# Patient Record
Sex: Female | Born: 1955 | Race: White | Hispanic: No | Marital: Single | Smoking: Never smoker
Health system: Southern US, Community
[De-identification: ages and names within clinical notes are randomized; demographics above are authoritative.]

## PROBLEM LIST (undated history)

## (undated) DIAGNOSIS — M797 Fibromyalgia: Secondary | ICD-10-CM

## (undated) DIAGNOSIS — G47419 Narcolepsy without cataplexy: Secondary | ICD-10-CM

## (undated) DIAGNOSIS — I10 Essential (primary) hypertension: Secondary | ICD-10-CM

## (undated) DIAGNOSIS — G9332 Myalgic encephalomyelitis/chronic fatigue syndrome: Secondary | ICD-10-CM

## (undated) HISTORY — PX: TONSILLECTOMY: SUR1361

## (undated) HISTORY — DX: Myalgic encephalomyelitis/chronic fatigue syndrome: G93.32

---

## 1998-03-05 ENCOUNTER — Ambulatory Visit (HOSPITAL_COMMUNITY): Admission: RE | Admit: 1998-03-05 | Discharge: 1998-03-05 | Payer: Self-pay | Admitting: Internal Medicine

## 1999-05-20 ENCOUNTER — Emergency Department (HOSPITAL_COMMUNITY): Admission: EM | Admit: 1999-05-20 | Discharge: 1999-05-20 | Payer: Self-pay | Admitting: Emergency Medicine

## 2001-01-09 ENCOUNTER — Emergency Department (HOSPITAL_COMMUNITY): Admission: EM | Admit: 2001-01-09 | Discharge: 2001-01-09 | Payer: Self-pay | Admitting: Emergency Medicine

## 2001-01-09 ENCOUNTER — Encounter: Payer: Self-pay | Admitting: Emergency Medicine

## 2006-10-11 ENCOUNTER — Encounter: Admission: RE | Admit: 2006-10-11 | Discharge: 2006-10-11 | Payer: Self-pay | Admitting: Internal Medicine

## 2007-07-30 ENCOUNTER — Encounter: Admission: RE | Admit: 2007-07-30 | Discharge: 2007-07-30 | Payer: Self-pay | Admitting: Internal Medicine

## 2010-11-28 ENCOUNTER — Encounter: Payer: Self-pay | Admitting: Internal Medicine

## 2018-07-11 ENCOUNTER — Ambulatory Visit (INDEPENDENT_AMBULATORY_CARE_PROVIDER_SITE_OTHER): Payer: Medicare Other

## 2018-07-11 ENCOUNTER — Ambulatory Visit (HOSPITAL_COMMUNITY)
Admission: EM | Admit: 2018-07-11 | Discharge: 2018-07-11 | Disposition: A | Discharge status: Home / Self Care | Payer: Medicare Other | Attending: Family Medicine | Admitting: Family Medicine

## 2018-07-11 ENCOUNTER — Encounter (HOSPITAL_COMMUNITY): Payer: Self-pay

## 2018-07-11 DIAGNOSIS — J181 Lobar pneumonia, unspecified organism: Secondary | ICD-10-CM

## 2018-07-11 DIAGNOSIS — R05 Cough: Secondary | ICD-10-CM | POA: Diagnosis not present

## 2018-07-11 DIAGNOSIS — R0789 Other chest pain: Secondary | ICD-10-CM

## 2018-07-11 DIAGNOSIS — J189 Pneumonia, unspecified organism: Secondary | ICD-10-CM

## 2018-07-11 HISTORY — DX: Fibromyalgia: M79.7

## 2018-07-11 HISTORY — DX: Narcolepsy without cataplexy: G47.419

## 2018-07-11 MED ORDER — BENZONATATE 100 MG PO CAPS: 100.0000 mg | ORAL_CAPSULE | Freq: Three times a day (TID) | ORAL | 0 refills | Status: DC

## 2018-07-11 MED ORDER — NAPROXEN 500 MG PO TABS: 500.0000 mg | ORAL_TABLET | Freq: Two times a day (BID) | ORAL | 0 refills | Status: DC

## 2018-07-11 MED ORDER — AZITHROMYCIN 250 MG PO TABS: ORAL_TABLET | ORAL | 0 refills | Status: AC

## 2018-07-11 NOTE — ED Triage Notes (Signed)
Pt presents with persistent cough, chest congestion and shoulder & neck pain .

## 2018-07-11 NOTE — ED Provider Notes (Signed)
[facility]    CSN: [id number] Arrival date & time: 07/11/18  1130     History   Chief Complaint Chief Complaint  Patient presents with  . Cough  . Neck & Shoulder Pain  . Chest Congestion    HPI Catherine Adkins is a 62 y.o. female.   Catherine Adkins presents with complaints of cough causing left posterior shoulder/upper back pain, upper chest pain, as well as neck pain. This has been ongoing for the past 5 days. Cough is non productive. No shortness of breath . No known fevers. Some sore throat. No ear pain. Has had loose stools, two episodes today, no blood. States has history of "sinus issues" with congestion. Has leg pain as well as wrist pain, states these can be normal flares for her, takes gabapentin. Has taken tylenol as well which has not helped. No known ill contacts. Has had pneumonia and has been hospitalized with such in the past. Hx of fibromyalgia and narcolepsy.    ROS per HPI.      Past Medical History:  Diagnosis Date  . Fibromyalgia   . Narcolepsy     There are no active problems to display for this patient.   Past Surgical History:  Procedure Laterality Date  . TONSILLECTOMY      OB History   None      Home Medications    Prior to Admission medications   Medication Sig Start Date End Date Taking? Authorizing Provider  azithromycin (ZITHROMAX) 250 MG tablet Take 2 tablets (500 mg total) by mouth daily for 1 day, THEN 1 tablet (250 mg total) daily for 4 days. 07/11/18 07/16/18  Georgetta Haber, NP  benzonatate (TESSALON) 100 MG capsule Take 1 capsule (100 mg total) by mouth every 8 (eight) hours. 07/11/18   Georgetta Haber, NP  naproxen (NAPROSYN) 500 MG tablet Take 1 tablet (500 mg total) by mouth 2 (two) times daily. 07/11/18   Georgetta Haber, NP    Family History History reviewed. No pertinent family history.  Social History Social History   Tobacco Use  . Smoking status: Not on file  Substance Use Topics  . Alcohol use: Not on  file  . Drug use: Not on file     Allergies   Patient has no known allergies.   Review of Systems Review of Systems   Physical Exam Triage Vital Signs ED Triage Vitals  Enc Vitals Group     BP 07/11/18 1240 (!) 138/59     Pulse Rate 07/11/18 1235 92     Resp 07/11/18 1235 19     Temp 07/11/18 1235 98.8 F (37.1 C)     Temp Source 07/11/18 1235 Oral     SpO2 07/11/18 1235 96 %     Weight --      Height --      Head Circumference --      Peak Flow --      Pain Score --      Pain Loc --      Pain Edu? --      Excl. in GC? --    No data found.  Updated Vital Signs BP (!) 138/59 (BP Location: Right Arm)   Pulse 92   Temp 98.8 F (37.1 C) (Oral)   Resp 19   LMP  (LMP Unknown)   SpO2 96%   Physical Exam  Constitutional: She is oriented to person, place, and time. She appears well-developed and well-nourished. No distress.  HENT:  Head: Normocephalic and atraumatic.  Right Ear: Tympanic membrane, external ear and ear canal normal.  Left Ear: Tympanic membrane, external ear and ear canal normal.  Nose: Nose normal.  Mouth/Throat: Uvula is midline, oropharynx is clear and moist and mucous membranes are normal. No tonsillar exudate.  Eyes: Pupils are equal, round, and reactive to light. Conjunctivae and EOM are normal.  Cardiovascular: Normal rate, regular rhythm and normal heart sounds.  Pulmonary/Chest: Effort normal and breath sounds normal. No stridor. She has no decreased breath sounds.     She exhibits tenderness.  Strong dry cough; posterior left chest wall with mild tenderness on palpation and worse with cough/deep breathing   Neurological: She is alert and oriented to person, place, and time.  Skin: Skin is warm and dry.   EKG NSR rate of 70 without acute changes noted   UC Treatments / Results  Labs (all labs ordered are listed, but only abnormal results are displayed) Labs Reviewed - No data to display  EKG None  Radiology Dg Chest 2  View  Result Date: 07/11/2018 CLINICAL DATA:  Five days of nonproductive cough, pain deep to the left shoulder blade. History of previous episodes of pneumonia and bronchitis. Nonsmoker. EXAM: CHEST - 2 VIEW COMPARISON:  Report of a chest x-ray of September 05, 2006. FINDINGS: There is hazy increased density in the posterior inferior aspect of the left upper lobe. The right lung is clear. There is no pleural effusion. The heart and pulmonary vascularity are normal. The mediastinum is normal in width. There may be a hiatal hernia. The bony thorax exhibits no acute abnormality. IMPRESSION: Hazy increased density in the left upper lobe posteriorly compatible with pneumonia. Followup PA and lateral chest X-ray is recommended in 3-4 weeks following trial of antibiotic therapy to ensure resolution and exclude underlying malignancy. Electronically Signed   By: David  [place] M.D.   On: 07/11/2018 14:00    Procedures Procedures (including critical care time)  Medications Ordered in UC Medications - No data to display  Initial Impression / Assessment and Plan / UC Course  I have reviewed the triage vital signs and the nursing notes.  Pertinent labs & imaging results that were available during my care of the patient were reviewed by me and considered in my medical decision making (see chart for details).     ekg without acute findings. Chest xray concerning with pneumonia, which is consistent with history. Course of azithromycin provided, tessalon prn, naproxen BID. Encouraged close follow up with PCP for recheck. Return precautions provided. Patient verbalized understanding and agreeable to plan.   Final Clinical Impressions(s) / UC Diagnoses   Final diagnoses:  Community acquired pneumonia of left upper lobe of lung [facility])     Discharge Instructions     Complete course of antibiotics.  Push fluids to ensure adequate hydration and keep secretions thin.  Naproxen twice a day to help with pain, take  with food.  Tessalon as needed for cough.  Please call and make appointment with PCP in the next week as you will likely need a repeat chest xray in the next month as well.  If develop increased pain, fevers, shortness of breath , weakness, confusion or otherwise worsening please return or go to the Er.     ED Prescriptions    Medication Sig Dispense Auth. Provider   azithromycin (ZITHROMAX) 250 MG tablet Take 2 tablets (500 mg total) by mouth daily for 1 day, THEN 1 tablet (250 mg total)  daily for 4 days. 6 tablet Linus Mako B, NP   naproxen (NAPROSYN) 500 MG tablet Take 1 tablet (500 mg total) by mouth 2 (two) times daily. 30 tablet Linus Mako B, NP   benzonatate (TESSALON) 100 MG capsule Take 1 capsule (100 mg total) by mouth every 8 (eight) hours. 21 capsule Georgetta Haber, NP     Controlled Substance Prescriptions [place] Controlled Substance Registry consulted? Not Applicable   Georgetta Haber, NP 07/11/18 1407

## 2018-07-11 NOTE — Discharge Instructions (Signed)
Complete course of antibiotics.  Push fluids to ensure adequate hydration and keep secretions thin.  Naproxen twice a day to help with pain, take with food.  Tessalon as needed for cough.  Please call and make appointment with PCP in the next week as you will likely need a repeat chest xray in the next month as well.  If develop increased pain, fevers, shortness of breath , weakness, confusion or otherwise worsening please return or go to the Er.

## 2018-12-07 ENCOUNTER — Ambulatory Visit (INDEPENDENT_AMBULATORY_CARE_PROVIDER_SITE_OTHER): Payer: Medicare Other

## 2018-12-07 ENCOUNTER — Ambulatory Visit (HOSPITAL_COMMUNITY)
Admission: EM | Admit: 2018-12-07 | Discharge: 2018-12-07 | Disposition: A | Discharge status: Home / Self Care | Payer: Medicare Other

## 2018-12-07 ENCOUNTER — Encounter (HOSPITAL_COMMUNITY): Payer: Self-pay | Admitting: Emergency Medicine

## 2018-12-07 DIAGNOSIS — J189 Pneumonia, unspecified organism: Secondary | ICD-10-CM | POA: Diagnosis not present

## 2018-12-07 HISTORY — DX: Essential (primary) hypertension: I10

## 2018-12-07 MED ORDER — HYDROCODONE-HOMATROPINE 5-1.5 MG/5ML PO SYRP: 5.0000 mL | ORAL_SOLUTION | Freq: Four times a day (QID) | ORAL | 0 refills | Status: DC | PRN

## 2018-12-07 MED ORDER — BENZONATATE 100 MG PO CAPS: 100.0000 mg | ORAL_CAPSULE | Freq: Three times a day (TID) | ORAL | 0 refills | Status: DC

## 2018-12-07 MED ORDER — AMOXICILLIN-POT CLAVULANATE 875-125 MG PO TABS: 1.0000 | ORAL_TABLET | Freq: Two times a day (BID) | ORAL | 0 refills | Status: DC

## 2018-12-07 NOTE — ED Triage Notes (Signed)
[facility] cough, nasal congestion, headaches, emesis with strong coughing, nausea x 2 weeks.

## 2018-12-07 NOTE — ED Provider Notes (Signed)
[facility]    CSN: [id number] Arrival date & time: 12/07/18  1047     History   Chief Complaint Chief Complaint  Patient presents with  . URI    HPI Catherine Adkins is a 63 y.o. female.   Patient is a 63 year old female past medical history of fibromyalgia, hypertension.  She presents with approximately 2 weeks of worsening cough, congestion, fatigue, fever.  She is having a lot of sinus pressure and facial pain.  She does have history of sinus infections.  She is also had some mild nausea without vomiting.  She has been taking Mucinex and Mucinex DM for the last 2 weeks without any relief of symptoms.  Coughing up minimal sputum. Denies any recent traveling or recent sick contacts. She does not smoke.   ROS per HPI      Past Medical History:  Diagnosis Date  . Fibromyalgia   . Hypertension   . Narcolepsy   . Narcolepsy     There are no active problems to display for this patient.   Past Surgical History:  Procedure Laterality Date  . TONSILLECTOMY      OB History   No obstetric history on file.      Home Medications    Prior to Admission medications   Medication Sig Start Date End Date Taking? Authorizing Provider  gabapentin (NEURONTIN) 600 MG tablet Take 600 mg by mouth 3 (three) times daily.   Yes [provider]  olmesartan (BENICAR) 20 MG tablet Take 20 mg by mouth daily.   Yes [provider]  sertraline (ZOLOFT) 50 MG tablet Take 50 mg by mouth daily.   Yes [provider]  amoxicillin-clavulanate (AUGMENTIN) 875-125 MG tablet Take 1 tablet by mouth every 12 (twelve) hours. 12/07/18   Dahlia Byes A, NP  benzonatate (TESSALON) 100 MG capsule Take 1 capsule (100 mg total) by mouth every 8 (eight) hours. 12/07/18   Anella Nakata, Gloris Manchester A, NP  HYDROcodone-homatropine (HYCODAN) 5-1.5 MG/5ML syrup Take 5 mLs by mouth every 6 (six) hours as needed for cough. 12/07/18   Dahlia Byes A, NP  naproxen (NAPROSYN) 500 MG tablet Take 1  tablet (500 mg total) by mouth 2 (two) times daily. 07/11/18   Georgetta Haber, NP    Family History History reviewed. No pertinent family history.  Social History Social History   Tobacco Use  . Smoking status: Never Smoker  . Smokeless tobacco: Never Used  Substance Use Topics  . Alcohol use: Not Currently  . Drug use: Never     Allergies   Prednisone   Review of Systems Review of Systems   Physical Exam Triage Vital Signs ED Triage Vitals [12/07/18 1147]  Enc Vitals Group     BP (!) 144/77     Pulse Rate 88     Resp 18     Temp 98.4 F (36.9 C)     Temp Source Oral     SpO2 97 %     Weight      Height      Head Circumference      Peak Flow      Pain Score 10     Pain Loc      Pain Edu?      Excl. in GC?    No data found.  Updated Vital Signs BP (!) 144/77 (BP Location: Right Arm)   Pulse 88   Temp 98.4 F (36.9 C) (Oral)   Resp 18  LMP  (LMP Unknown)   SpO2 97%   Visual Acuity Right Eye Distance:   Left Eye Distance:   Bilateral Distance:    Right Eye Near:   Left Eye Near:    Bilateral Near:     Physical Exam Constitutional:      General: She is not in acute distress.    Appearance: She is ill-appearing. She is not toxic-appearing or diaphoretic.  HENT:     Head: Normocephalic and atraumatic.     Right Ear: Tympanic membrane and ear canal normal. There is impacted cerumen.     Left Ear: Tympanic membrane and ear canal normal.     Nose: Mucosal edema, congestion and rhinorrhea present.     Mouth/Throat:     Mouth: No oral lesions.     Tongue: Lesions present.     Pharynx: Oropharynx is clear.  Neck:     Musculoskeletal: Normal range of motion.  Cardiovascular:     Rate and Rhythm: Normal rate and regular rhythm.     Pulses: Normal pulses.     Heart sounds: Normal heart sounds.  Pulmonary:     Effort: Pulmonary effort is normal.     Comments: Decreased lung sounds in all fields Harsh cough.  Musculoskeletal: Normal range of  motion.  Lymphadenopathy:     Cervical: Cervical adenopathy present.  Skin:    General: Skin is warm and dry.  Psychiatric:        Mood and Affect: Mood normal.      UC Treatments / Results  Labs (all labs ordered are listed, but only abnormal results are displayed) Labs Reviewed - No data to display  EKG None  Radiology Dg Chest 2 View  Result Date: 12/07/2018 CLINICAL DATA:  Productive cough, chest pain, fever. EXAM: CHEST - 2 VIEW COMPARISON:  Radiographs of July 11, 2018. FINDINGS: The heart size and mediastinal contours are within normal limits. No pneumothorax or pleural effusion is noted. Right lung is clear. Mild left basilar atelectasis or infiltrate is noted. The visualized skeletal structures are unremarkable. IMPRESSION: Mild left basilar opacity is noted concerning for atelectasis or infiltrate. Continued radiographic follow-up is recommended. Electronically Signed   By: Lupita RaiderJames  Green Jr, M.D.   On: 12/07/2018 13:06    Procedures Procedures (including critical care time)  Medications Ordered in UC Medications - No data to display  Initial Impression / Assessment and Plan / UC Course  I have reviewed the triage vital signs and the nursing notes.  Pertinent labs & imaging results that were available during my care of the patient were reviewed by me and considered in my medical decision making (see chart for details).     Patient is a 63 year old female that presents with continued congestion, cough and fatigue over the past 2 weeks.  Her symptoms have worsened. Her x-ray did reveal pneumonia We will go ahead and treat for community-acquired pneumonia Augmentin twice a day for 7 days Tessalon Perles for cough as needed every 8 hours Hycodan cough syrup to use at bedtime for worsening cough Follow up as needed for continued or worsening symptoms  Final Clinical Impressions(s) / UC Diagnoses   Final diagnoses:  Community acquired pneumonia, unspecified  laterality     Discharge Instructions     Your x-ray did reveal pneumonia We will treat this with antibiotics Augmentin twice a day for 7 days You can do Tessalon Perles every 8 hours for cough    ED Prescriptions    Medication Sig  Dispense Auth. Provider   amoxicillin-clavulanate (AUGMENTIN) 875-125 MG tablet Take 1 tablet by mouth every 12 (twelve) hours. 14 tablet Darletta Noblett A, NP   benzonatate (TESSALON) 100 MG capsule Take 1 capsule (100 mg total) by mouth every 8 (eight) hours. 21 capsule Desarea Ohagan A, NP   HYDROcodone-homatropine (HYCODAN) 5-1.5 MG/5ML syrup Take 5 mLs by mouth every 6 (six) hours as needed for cough. 120 mL Dahlia Byes A, NP     Controlled Substance Prescriptions [place] Controlled Substance Registry consulted? Not Applicable   Janace Aris, NP 12/07/18 2054

## 2018-12-07 NOTE — Discharge Instructions (Signed)
Your x-ray did reveal pneumonia We will treat this with antibiotics Augmentin twice a day for 7 days You can do Tessalon Perles every 8 hours for cough

## 2021-11-07 ENCOUNTER — Emergency Department (HOSPITAL_BASED_OUTPATIENT_CLINIC_OR_DEPARTMENT_OTHER)
Admission: EM | Admit: 2021-11-07 | Discharge: 2021-11-07 | Disposition: A | Discharge status: Home / Self Care | Payer: Medicare Other | Attending: Emergency Medicine | Admitting: Emergency Medicine

## 2021-11-07 ENCOUNTER — Encounter (HOSPITAL_BASED_OUTPATIENT_CLINIC_OR_DEPARTMENT_OTHER): Payer: Self-pay

## 2021-11-07 ENCOUNTER — Other Ambulatory Visit: Payer: Self-pay

## 2021-11-07 DIAGNOSIS — R059 Cough, unspecified: Secondary | ICD-10-CM | POA: Diagnosis present

## 2021-11-07 DIAGNOSIS — U071 COVID-19: Secondary | ICD-10-CM | POA: Diagnosis not present

## 2021-11-07 DIAGNOSIS — R110 Nausea: Secondary | ICD-10-CM | POA: Insufficient documentation

## 2021-11-07 LAB — BASIC METABOLIC PANEL
Anion gap: 7 (ref 5–15)
BUN: 13 mg/dL (ref 8–23)
CO2: 28 mmol/L (ref 22–32)
Calcium: 9.1 mg/dL (ref 8.9–10.3)
Chloride: 106 mmol/L (ref 98–111)
Creatinine, Ser: 0.85 mg/dL (ref 0.44–1.00)
GFR, Estimated: >60 mL/min (ref >60)
Glucose, Bld: 105 mg/dL — ABNORMAL HIGH (ref 70–99)
Potassium: 3.9 mmol/L (ref 3.5–5.1)
Sodium: 141 mmol/L (ref 135–145)

## 2021-11-07 MED ORDER — NIRMATRELVIR/RITONAVIR (PAXLOVID)TABLET: 3.0000 | ORAL_TABLET | Freq: Two times a day (BID) | ORAL | 0 refills | Status: AC

## 2021-11-07 MED ORDER — BENZONATATE 100 MG PO CAPS: 100.0000 mg | ORAL_CAPSULE | Freq: Three times a day (TID) | ORAL | 0 refills | Status: DC

## 2021-11-07 NOTE — ED Triage Notes (Signed)
Pt states she tested pos for COVID today with at home test and symptoms began yesterday. Pt seeking Paxlovid prescription. Pt denies CP,  SOB. NAD during triage

## 2021-11-07 NOTE — ED Provider Notes (Signed)
[place] EMERGENCY DEPT Provider Note   CSN: [id number] Arrival date & time: 11/07/21  1232     History  Chief Complaint  Patient presents with   Covid Positive    Catherine Adkins is a 66 y.o. female.  Patient with history of pneumonia presents to the emergency department today for evaluation of COVID symptoms.  Patient started having sore throat, mild cough, congestion, body aches yesterday.  She took a home antigen test and was positive.  She reports a couple of other sick contacts in her home.  She is here requesting Paxlovid prescription to try to decrease severity of illness given her history of pneumonia.  No chest pain or shortness of breath.  She has had nausea but no vomiting. The onset of this condition was acute. The course is constant. Aggravating factors: none. Alleviating factors: none.  She has received COVID vaccines.  No history of chronic kidney disease.       Home Medications Prior to Admission medications   Medication Sig Start Date End Date Taking? Authorizing Provider  amoxicillin-clavulanate (AUGMENTIN) 875-125 MG tablet Take 1 tablet by mouth every 12 (twelve) hours. 12/07/18   Loura Halt A, NP  benzonatate (TESSALON) 100 MG capsule Take 1 capsule (100 mg total) by mouth every 8 (eight) hours. 12/07/18   Loura Halt A, NP  gabapentin (NEURONTIN) 600 MG tablet Take 600 mg by mouth 3 (three) times daily.    [provider]  HYDROcodone-homatropine (HYCODAN) 5-1.5 MG/5ML syrup Take 5 mLs by mouth every 6 (six) hours as needed for cough. 12/07/18   Loura Halt A, NP  naproxen (NAPROSYN) 500 MG tablet Take 1 tablet (500 mg total) by mouth 2 (two) times daily. 07/11/18   Zigmund Gottron, NP  olmesartan (BENICAR) 20 MG tablet Take 20 mg by mouth daily.    [provider]  sertraline (ZOLOFT) 50 MG tablet Take 50 mg by mouth daily.    [provider]      Allergies    Azithromycin and Prednisone    Review of Systems    Review of Systems  Constitutional:  Positive for fatigue. Negative for fever.  HENT:  Positive for congestion and sore throat. Negative for rhinorrhea.   Eyes:  Negative for redness.  Respiratory:  Positive for cough. Negative for shortness of breath.   Cardiovascular:  Negative for chest pain.  Gastrointestinal:  Positive for nausea. Negative for abdominal pain, diarrhea and vomiting.  Genitourinary:  Negative for dysuria, frequency, hematuria and urgency.  Musculoskeletal:  Positive for myalgias.  Skin:  Negative for rash.  Neurological:  Positive for headaches.   Physical Exam Updated Vital Signs BP 139/79    Pulse 74    Temp 97.6 F (36.4 C)    Resp 16    Ht 5\' 6"  (1.676 m)    Wt 90.7 kg    LMP  (LMP Unknown)    SpO2 99%    BMI 32.28 kg/m  Physical Exam Vitals and nursing note reviewed.  Constitutional:      Appearance: She is well-developed.  HENT:     Head: Normocephalic and atraumatic.     Jaw: No trismus.     Right Ear: Tympanic membrane, ear canal and external ear normal.     Left Ear: Tympanic membrane, ear canal and external ear normal.     Nose: Nose normal. No mucosal edema or rhinorrhea.     Mouth/Throat:     Mouth: Mucous membranes are moist. Mucous  membranes are not dry. No oral lesions.     Pharynx: Uvula midline. No oropharyngeal exudate, posterior oropharyngeal erythema or uvula swelling.     Tonsils: No tonsillar abscesses.  Eyes:     General:        Right eye: No discharge.        Left eye: No discharge.     Conjunctiva/sclera: Conjunctivae normal.  Cardiovascular:     Rate and Rhythm: Normal rate and regular rhythm.     Heart sounds: Normal heart sounds.  Pulmonary:     Effort: Pulmonary effort is normal. No respiratory distress.     Breath sounds: Wheezing present. No rales.     Comments: Minimal basilar exp wheeze Abdominal:     Palpations: Abdomen is soft.     Tenderness: There is no abdominal tenderness.  Musculoskeletal:     Cervical back:  Normal range of motion and neck supple.  Lymphadenopathy:     Cervical: No cervical adenopathy.  Skin:    General: Skin is warm and dry.  Neurological:     Mental Status: She is alert.  Psychiatric:        Mood and Affect: Mood normal.    ED Results / Procedures / Treatments   Labs (all labs ordered are listed, but only abnormal results are displayed) Labs Reviewed  BASIC METABOLIC PANEL - Abnormal; Notable for the following components:      Result Value   Glucose, Bld 105 (*)    All other components within normal limits    EKG None  Radiology No results found.  Procedures Procedures    Medications Ordered in ED Medications - No data to display  ED Course/ Medical Decision Making/ A&P    Patient seen and examined. Plan discussed with patient.   Labs: BMP to evaluate creatinine prior to antiviral prescription  Imaging: None  Medications/Fluids: DC home with oral Paxlovid, tessalon  Vital signs reviewed and are as follows: BP 139/79    Pulse 74    Temp 97.6 F (36.4 C)    Resp 16    Ht 5\' 6"  (1.676 m)    Wt 90.7 kg    LMP  (LMP Unknown)    SpO2 99%    BMI 32.28 kg/m   Initial impression: COVID-19, mild  5:53 PM BMP performed and resulted.  Normal kidney function.  Home treatment: Prescription written for Paxlovid, tessalon. Counseled to use tylenol and ibuprofen for supportive treatment.   Follow-up: Encouraged patient to follow-up with their provider if symptoms persist for 5 days.   Detailed discussion had with with patient regarding COVID-19 precautions and written instructions given as well.  We discussed need to isolate themselves for 5 days from onset of symptoms and have 24 hours of improvement prior to breaking isolation.  We discussed that when breaking isolation, mask wearing for 5 additional days is required.  We discussed signs symptoms to return which include worsening shortness of breath, trouble breathing, or increased work of breathing.  Also  return with persistent vomiting, confusion, passing out, or if they have any other concerns. Counseled on the need for rest and good hydration. Discussed that high-risk contacts should be aware of positive result and they need to quarantine and be tested if they develop any symptoms. Patient verbalizes understanding.   Catherine Adkins was evaluated in Emergency Department on 11/07/2021 for the symptoms described in the history of present illness. She was evaluated in the context of the global COVID-19 pandemic, which  necessitated consideration that the patient might be at risk for infection with the SARS-CoV-2 virus that causes COVID-19. Institutional protocols and algorithms that pertain to the evaluation of patients at risk for COVID-19 are in a state of rapid change based on information released by regulatory bodies including the CDC and federal and state organizations. These policies and algorithms were followed during the patient's care in the ED.                             Medical Decision Making  Patient with viral URI symptoms, home test positive for COVID.  Patient started on antiviral medication due to risk factors of age, history of pneumonia.  Normal kidney function confirmed.  She otherwise appears well.  Doubt bacterial pneumonia, PE, ACS.          Final Clinical Impression(s) / ED Diagnoses Final diagnoses:  COVID-19    Rx / DC Orders ED Discharge Orders          Ordered    nirmatrelvir/ritonavir EUA (PAXLOVID) 20 x 150 MG & 10 x 100MG  TABS  2 times daily        11/07/21 1751    benzonatate (TESSALON) 100 MG capsule  Every 8 hours        11/07/21 1751              Carlisle Cater, PA-C 11/07/21 1754    Fredia Sorrow, MD 11/18/21 [phone]

## 2021-11-07 NOTE — Discharge Instructions (Signed)
Please read and follow all provided instructions.  Your diagnoses today include:  1. COVID-19     Tests performed today include: Vital signs. See below for your results today.  Basic metabolic panel: no problems  Medications prescribed:  Paxlovid  Tessalon Perles - cough suppressant medication  Take any prescribed medications only as directed. Treatment for your infection is aimed at treating the symptoms. There are no medications, such as antibiotics, that will cure your infection.   Home care instructions:  Follow any educational materials contained in this packet.   Your illness is contagious and can be spread to others, especially during the first 3 or 4 days. It cannot be cured by antibiotics or other medicines. Take basic precautions such as washing your hands often, covering your mouth when you cough or sneeze, and avoiding public places where you could spread your illness to others.   Please continue drinking plenty of fluids.  Use over-the-counter medicines as needed as directed on packaging for symptom relief.  You may also use ibuprofen or tylenol as directed on packaging for pain or fever.  Do not take multiple medicines containing Tylenol or acetaminophen to avoid taking too much of this medication.  If you are positive for Covid-19, you should isolate yourself and not be exposed to other people for 5 days after your symptoms began. If you are not feeling better at day 5, you need to isolate yourself for a total of 10 days. If you are feeling better by day 5, you should wear a mask properly, over your nose and mouth, at all times while around other people until 10 days after your symptoms started.   Follow-up instructions: Please follow-up with your primary care provider as needed for further evaluation of your symptoms if you are not feeling better.   Return instructions:  Please return to the Emergency Department if you experience worsening symptoms.  Return to the  emergency department if you have worsening shortness of breath breathing or increased work of breathing, persistent vomiting RETURN IMMEDIATELY IF you develop shortness of breath, confusion or altered mental status, a new rash, become dizzy, faint, or poorly responsive, or are unable to be cared for at home. Please return if you have persistent vomiting and cannot keep down fluids or develop a fever that is not controlled by tylenol or motrin.   Please return if you have any other emergent concerns.  Additional Information:  Your vital signs today were: BP 139/79    Pulse 74    Temp 97.6 F (36.4 C)    Resp 16    Ht 5\' 6"  (1.676 m)    Wt 90.7 kg    LMP  (LMP Unknown)    SpO2 99%    BMI 32.28 kg/m  If your blood pressure (BP) was elevated above 135/85 this visit, please have this repeated by your doctor within one month. --------------

## 2021-11-07 NOTE — ED Notes (Signed)
EMT-P provided AVS using Teachback Method. Patient verbalizes understanding of Discharge Instructions. Opportunity for Questioning and Answers were provided by EMT-P. Patient Discharged from ED.  ? ?

## 2022-01-11 ENCOUNTER — Other Ambulatory Visit: Payer: Self-pay | Admitting: Family Medicine

## 2022-01-11 DIAGNOSIS — Z1231 Encounter for screening mammogram for malignant neoplasm of breast: Secondary | ICD-10-CM

## 2022-02-15 ENCOUNTER — Ambulatory Visit: Payer: Medicare Other

## 2022-03-01 ENCOUNTER — Ambulatory Visit
Admission: RE | Admit: 2022-03-01 | Discharge: 2022-03-01 | Disposition: A | Discharge status: Home / Self Care | Payer: Medicare Other | Source: Ambulatory Visit | Attending: Family Medicine | Admitting: Family Medicine

## 2022-03-01 DIAGNOSIS — Z1231 Encounter for screening mammogram for malignant neoplasm of breast: Secondary | ICD-10-CM

## 2022-07-05 ENCOUNTER — Ambulatory Visit (HOSPITAL_COMMUNITY)
Admission: RE | Admit: 2022-07-05 | Discharge: 2022-07-05 | Disposition: A | Discharge status: Home / Self Care | Payer: Medicare Other | Source: Ambulatory Visit | Attending: Physician Assistant | Admitting: Physician Assistant

## 2022-07-05 ENCOUNTER — Ambulatory Visit (HOSPITAL_COMMUNITY): Payer: Medicare Other

## 2022-07-05 VITALS — BP 150/91 | HR 79 | Temp 99.0°F | Resp 18

## 2022-07-05 DIAGNOSIS — K219 Gastro-esophageal reflux disease without esophagitis: Secondary | ICD-10-CM | POA: Diagnosis present

## 2022-07-05 DIAGNOSIS — K30 Functional dyspepsia: Secondary | ICD-10-CM | POA: Insufficient documentation

## 2022-07-05 DIAGNOSIS — M797 Fibromyalgia: Secondary | ICD-10-CM | POA: Insufficient documentation

## 2022-07-05 DIAGNOSIS — R1084 Generalized abdominal pain: Secondary | ICD-10-CM | POA: Insufficient documentation

## 2022-07-05 DIAGNOSIS — G8929 Other chronic pain: Secondary | ICD-10-CM | POA: Insufficient documentation

## 2022-07-05 DIAGNOSIS — K5732 Diverticulitis of large intestine without perforation or abscess without bleeding: Secondary | ICD-10-CM

## 2022-07-05 DIAGNOSIS — K2900 Acute gastritis without bleeding: Secondary | ICD-10-CM

## 2022-07-05 LAB — CBC WITH DIFFERENTIAL/PLATELET
Abs Immature Granulocytes: 0.02 10*3/uL (ref 0.00–0.07)
Basophils Absolute: 0.1 10*3/uL (ref 0.0–0.1)
Basophils Relative: 1 %
Eosinophils Absolute: 0.2 10*3/uL (ref 0.0–0.5)
Eosinophils Relative: 3 %
HCT: 39 % (ref 36.0–46.0)
Hemoglobin: 12.5 g/dL (ref 12.0–15.0)
Immature Granulocytes: 0 %
Lymphocytes Relative: 29 %
Lymphs Abs: 1.6 10*3/uL (ref 0.7–4.0)
MCH: 27.2 pg (ref 26.0–34.0)
MCHC: 32.1 g/dL (ref 30.0–36.0)
MCV: 84.8 fL (ref 80.0–100.0)
Monocytes Absolute: 0.4 10*3/uL (ref 0.1–1.0)
Monocytes Relative: 7 %
Neutro Abs: 3.3 10*3/uL (ref 1.7–7.7)
Neutrophils Relative %: 60 %
Platelets: 206 10*3/uL (ref 150–400)
RBC: 4.6 MIL/uL (ref 3.87–5.11)
RDW: 15.1 % (ref 11.5–15.5)
WBC: 5.6 10*3/uL (ref 4.0–10.5)
nRBC: 0 % (ref 0.0–0.2)

## 2022-07-05 LAB — POCT URINALYSIS DIPSTICK, ED / UC
Glucose, UA: NEGATIVE mg/dL
Hgb urine dipstick: NEGATIVE
Ketones, ur: 15 mg/dL — AB
Leukocytes,Ua: NEGATIVE
Nitrite: NEGATIVE
Protein, ur: 30 mg/dL — AB
Specific Gravity, Urine: >=1.03 (ref 1.005–1.030)
Urobilinogen, UA: 0.2 mg/dL (ref 0.0–1.0)
pH: 5 (ref 5.0–8.0)

## 2022-07-05 LAB — COMPREHENSIVE METABOLIC PANEL
ALT: 15 U/L (ref 0–44)
AST: 27 U/L (ref 15–41)
Albumin: 4.3 g/dL (ref 3.5–5.0)
Alkaline Phosphatase: 53 U/L (ref 38–126)
Anion gap: 10 (ref 5–15)
BUN: 13 mg/dL (ref 8–23)
CO2: 21 mmol/L — ABNORMAL LOW (ref 22–32)
Calcium: 9.8 mg/dL (ref 8.9–10.3)
Chloride: 108 mmol/L (ref 98–111)
Creatinine, Ser: 1.2 mg/dL — ABNORMAL HIGH (ref 0.44–1.00)
GFR, Estimated: 51 mL/min — ABNORMAL LOW (ref >60)
Glucose, Bld: 105 mg/dL — ABNORMAL HIGH (ref 70–99)
Potassium: 4.3 mmol/L (ref 3.5–5.1)
Sodium: 139 mmol/L (ref 135–145)
Total Bilirubin: 1 mg/dL (ref 0.3–1.2)
Total Protein: 6.7 g/dL (ref 6.5–8.1)

## 2022-07-05 LAB — TSH: TSH: 3.458 u[IU]/mL (ref 0.350–4.500)

## 2022-07-05 MED ORDER — HYDROCODONE-ACETAMINOPHEN 5-325 MG PO TABS: 1.0000 | ORAL_TABLET | Freq: Four times a day (QID) | ORAL | 0 refills | Status: AC | PRN

## 2022-07-05 MED ORDER — LANSOPRAZOLE 30 MG PO CPDR: 30.0000 mg | DELAYED_RELEASE_CAPSULE | Freq: Two times a day (BID) | ORAL | 0 refills | Status: DC

## 2022-07-05 MED ORDER — AMOXICILLIN-POT CLAVULANATE 875-125 MG PO TABS: 1.0000 | ORAL_TABLET | Freq: Two times a day (BID) | ORAL | 0 refills | Status: DC

## 2022-07-05 NOTE — ED Triage Notes (Signed)
Pt states that she has lower left sided abdominal pain that started about a month ago, that has got worse. Pain is radiating to her lower back.  She has seen some dark brown clotting blood in her stools also some BRB as well. She has decreased appetite. She has a family history of diverticulitis. She use to take Nexium for Genella Rife but insurance doesn't help she is taking Tagamet 2 PO QD.   Patient is having some leg and arm pain as well.

## 2022-07-05 NOTE — ED Provider Notes (Signed)
[facility]    CSN: [id number] Arrival date & time: 07/05/22  1032      History   Chief Complaint Chief Complaint  Patient presents with   Abdominal Pain    HPI Catherine Adkins is a 66 y.o. female.   66 year old female presents with abdominal pain.  Patient indicates for the past month she has been having intermittent but persistently progressive abdominal pain.  She relates that the pain is in the left lower quadrant, sharp, intermittent, but recurrent.  Patient indicates that she has had nausea associated but no vomiting.  She also indicates that she has had some dark stools but also has noticed some bright red blood with bowel movements as well.  She indicates that the bright red blood BPs due to hemorrhoids and she also has these occur intermittently.  She also indicates she has had mild fever, with fatigue.  Patient relates that her pain is a 7-8 out of 10 and is not relieved with Naprosyn. Patient also indicates that she has longstanding indigestion with reflux.  She relates she was taking Nexium which worked but her insurance company stopped paying for that medication and she has only been able to take Tagamet OTC.  This helps intermittently but she continues to take Naprosyn for her chronic pain from from fibromyalgia and chronic fatigue syndrome combination.  Patient relates that she continues to have a lot of diffuse pain all over upper and lower extremities but also is continuing to have abdominal pain.  Patient denies any urinary symptoms. Patient relates that she has not been evaluated by a GI specialist for her abdominal pain, dyspepsia, reflux, or left lower quadrant pain.   Abdominal Pain Associated symptoms: diarrhea (intermittent) and nausea     Past Medical History:  Diagnosis Date   Fibromyalgia    Hypertension    Narcolepsy    Narcolepsy     There are no problems to display for this patient.   Past Surgical History:  Procedure Laterality Date    TONSILLECTOMY      OB History   No obstetric history on file.      Home Medications    Prior to Admission medications   Medication Sig Start Date End Date Taking? Authorizing Provider  amoxicillin-clavulanate (AUGMENTIN) 875-125 MG tablet Take 1 tablet by mouth every 12 (twelve) hours. 07/05/22  Yes Ellsworth Lennox, PA-C  Cimetidine (TAGAMET PO) Take 2 tablets by mouth daily.   Yes [provider]  gabapentin (NEURONTIN) 600 MG tablet Take 600 mg by mouth 3 (three) times daily.   Yes [provider]  HYDROcodone-acetaminophen (NORCO/VICODIN) 5-325 MG tablet Take 1-2 tablets by mouth every 6 (six) hours as needed. 07/05/22  Yes Ellsworth Lennox, PA-C  lansoprazole (PREVACID) 30 MG capsule Take 1 capsule (30 mg total) by mouth 2 (two) times daily before a meal. 07/05/22  Yes Ellsworth Lennox, PA-C  olmesartan (BENICAR) 20 MG tablet Take 20 mg by mouth daily.   Yes [provider]  sertraline (ZOLOFT) 50 MG tablet Take 50 mg by mouth daily.   Yes [provider]  benzonatate (TESSALON) 100 MG capsule Take 1 capsule (100 mg total) by mouth every 8 (eight) hours. 11/07/21   Renne Crigler, PA-C  naproxen (NAPROSYN) 500 MG tablet Take 500 mg by mouth 2 (two) times daily. 07/02/22   [provider]  ondansetron (ZOFRAN-ODT) 4 MG disintegrating tablet Take 4 mg by mouth 4 (four) times daily. 04/06/22   [provider]  rizatriptan (MAXALT) 10 MG tablet Take 10 mg by mouth daily. 04/06/22   [provider]  SUMAtriptan Willette Brace) 5 MG/ACT nasal spray SMARTSIG:Both Nares 07/02/22   [provider]  SUMAtriptan 6 MG/0.5ML SOAJ Inject into the muscle. 06/08/22   [provider]  zolmitriptan (ZOMIG) 5 MG nasal solution 1 spray daily. 04/06/22   [provider]    Family History Family History  Problem Relation Age of Onset   Breast cancer Neg Hx     Social History Social History   Tobacco Use   Smoking status: Never    Smokeless tobacco: Never  Substance Use Topics   Alcohol use: Not Currently   Drug use: Never     Allergies   Azithromycin and Prednisone   Review of Systems Review of Systems  Gastrointestinal:  Positive for abdominal pain (Left lower side and in the middle), diarrhea (intermittent) and nausea.     Physical Exam Triage Vital Signs ED Triage Vitals  Enc Vitals Group     BP 07/05/22 1050 (!) 150/91     Pulse Rate 07/05/22 1050 79     Resp 07/05/22 1050 18     Temp 07/05/22 1050 99 F (37.2 C)     Temp Source 07/05/22 1050 Oral     SpO2 07/05/22 1050 99 %     Weight --      Height --      Head Circumference --      Peak Flow --      Pain Score 07/05/22 1044 6     Pain Loc --      Pain Edu? --      Excl. in GC? --    No data found.  Updated Vital Signs BP (!) 150/91 (BP Location: Left Arm)   Pulse 79   Temp 99 F (37.2 C) (Oral)   Resp 18   LMP  (LMP Unknown)   SpO2 99%   Visual Acuity Right Eye Distance:   Left Eye Distance:   Bilateral Distance:    Right Eye Near:   Left Eye Near:    Bilateral Near:     Physical Exam Constitutional:      Appearance: She is well-developed.  Cardiovascular:     Rate and Rhythm: Normal rate and regular rhythm.     Heart sounds: Normal heart sounds.  Pulmonary:     Effort: Pulmonary effort is normal.     Breath sounds: Normal breath sounds and air entry. No wheezing, rhonchi or rales.  Abdominal:     General: Abdomen is flat. Bowel sounds are normal.     Palpations: Abdomen is soft.     Tenderness: There is abdominal tenderness in the epigastric area and left lower quadrant. There is no guarding or rebound.       Comments: Abdomen: Pain on palpation of the left lower quadrant without guarding.  Lymphadenopathy:     Cervical: No cervical adenopathy.  Neurological:     Mental Status: She is alert.      UC Treatments / Results  Labs (all labs ordered are listed, but only abnormal results are displayed) Labs  Reviewed  POCT URINALYSIS DIPSTICK, ED / UC - Abnormal; Notable for the following components:      Result Value   Bilirubin Urine SMALL (*)    Ketones, ur 15 (*)    Protein, ur 30 (*)    All other components within normal limits  CBC WITH DIFFERENTIAL/PLATELET  COMPREHENSIVE METABOLIC PANEL  TSH    EKG   Radiology No results found.  Procedures Procedures (including critical care time)  Medications Ordered in UC Medications - No data to display  Initial Impression / Assessment and Plan / UC Course  I have reviewed the triage vital signs and the nursing notes.  Pertinent labs & imaging results that were available during my care of the patient were reviewed by me and considered in my medical decision making (see chart for details).    Plan: 1.  Internal referral has been made to [facility] gastro[facility]ology for evaluation of the persistent dyspepsia, reflux, and abdominal pain. 2.  Advised patient to take Augmentin 875 twice daily until completed to treat the diverticulitis. 3.  Advised the patient to take the Prevacid 30 mg twice daily to try to treat the dyspepsia and reflux. 4.  Advised patient to stop the Naprosyn and use Vicodin tablets 1-2 every 6-8 hours for pain relief only on a temporary basis for the next couple days. 5.  CBC, CMP, and TSH are pending. 6.  Advised to follow-up PCP or return to urgent care if symptoms fail to improve. Final Clinical Impressions(s) / UC Diagnoses   Final diagnoses:  Other acute gastritis without hemorrhage  Gastroesophageal reflux disease without esophagitis  Diverticulitis of colon without hemorrhage  Generalized abdominal pain     Discharge Instructions      Labs for complete blood count and metabolic profile will be completed in 48 hours.  If you do not get a call from this office that indicates the labs are normal.  You can go on MyChart to view the results when they post in 24 to 48 hours. An internal GI referral has been made  to [facility], they should be calling you within next 48 hours to arrange an appointment to evaluate your symptoms. A prescription for Prevacid 30 mg 30 minutes before breakfast and supper has been sent to the pharmacy to see if this will help improve your indigestion and reflux. A prescription for Augmentin 875 1 twice a day has been to the pharmacy to treat the diverticulitis. A prescription for Vicodin tablets has been sent to the pharmacy, 1-2 every 6-8 hours for pain relief has been sent to the pharmacy. Advised to stop taking Naprosyn as this may aggravate your indigestion and reflux. Advised to follow-up PCP or return to urgent care if symptoms fail to improve.    ED Prescriptions     Medication Sig Dispense Auth. Provider   lansoprazole (PREVACID) 30 MG capsule Take 1 capsule (30 mg total) by mouth 2 (two) times daily before a meal. 60 capsule Ellsworth Lennox, PA-C   HYDROcodone-acetaminophen (NORCO/VICODIN) 5-325 MG tablet Take 1-2 tablets by mouth every 6 (six) hours as needed. 24 tablet Ellsworth Lennox, PA-C   amoxicillin-clavulanate (AUGMENTIN) 875-125 MG tablet Take 1 tablet by mouth every 12 (twelve) hours. 14 tablet Ellsworth Lennox, PA-C      I have reviewed the PDMP during this encounter.   Ellsworth Lennox, PA-C 07/05/22 1203

## 2022-07-05 NOTE — Discharge Instructions (Signed)
Labs for complete blood count and metabolic profile will be completed in 48 hours.  If you do not get a call from this office that indicates the labs are normal.  You can go on MyChart to view the results when they post in 24 to 48 hours. An internal GI referral has been made to [facility], they should be calling you within next 48 hours to arrange an appointment to evaluate your symptoms. A prescription for Prevacid 30 mg 30 minutes before breakfast and supper has been sent to the pharmacy to see if this will help improve your indigestion and reflux. A prescription for Augmentin 875 1 twice a day has been to the pharmacy to treat the diverticulitis. A prescription for Vicodin tablets has been sent to the pharmacy, 1-2 every 6-8 hours for pain relief has been sent to the pharmacy. Advised to stop taking Naprosyn as this may aggravate your indigestion and reflux. Advised to follow-up PCP or return to urgent care if symptoms fail to improve.

## 2022-07-06 ENCOUNTER — Encounter (HOSPITAL_COMMUNITY): Payer: Self-pay | Admitting: Emergency Medicine

## 2022-07-12 ENCOUNTER — Ambulatory Visit (INDEPENDENT_AMBULATORY_CARE_PROVIDER_SITE_OTHER): Payer: Medicare Other | Admitting: Gastroenterology

## 2022-07-12 ENCOUNTER — Encounter: Payer: Self-pay | Admitting: Gastroenterology

## 2022-07-12 ENCOUNTER — Other Ambulatory Visit (INDEPENDENT_AMBULATORY_CARE_PROVIDER_SITE_OTHER): Payer: Medicare Other

## 2022-07-12 VITALS — BP 102/80 | HR 91 | Ht 66.0 in | Wt 178.0 lb

## 2022-07-12 DIAGNOSIS — K219 Gastro-esophageal reflux disease without esophagitis: Secondary | ICD-10-CM | POA: Diagnosis not present

## 2022-07-12 DIAGNOSIS — R1013 Epigastric pain: Secondary | ICD-10-CM

## 2022-07-12 DIAGNOSIS — K921 Melena: Secondary | ICD-10-CM | POA: Diagnosis not present

## 2022-07-12 DIAGNOSIS — R194 Change in bowel habit: Secondary | ICD-10-CM

## 2022-07-12 DIAGNOSIS — R1032 Left lower quadrant pain: Secondary | ICD-10-CM

## 2022-07-12 DIAGNOSIS — K6289 Other specified diseases of anus and rectum: Secondary | ICD-10-CM

## 2022-07-12 DIAGNOSIS — N179 Acute kidney failure, unspecified: Secondary | ICD-10-CM

## 2022-07-12 LAB — BASIC METABOLIC PANEL
BUN: 12 mg/dL (ref 6–23)
CO2: 29 mEq/L (ref 19–32)
Calcium: 10.1 mg/dL (ref 8.4–10.5)
Chloride: 103 mEq/L (ref 96–112)
Creatinine, Ser: 0.85 mg/dL (ref 0.40–1.20)
GFR: 72.26 mL/min (ref >60.00)
Glucose, Bld: 104 mg/dL — ABNORMAL HIGH (ref 70–99)
Potassium: 3.8 mEq/L (ref 3.5–5.1)
Sodium: 141 mEq/L (ref 135–145)

## 2022-07-12 MED ORDER — CLENPIQ 10-3.5-12 MG-GM -GM/175ML PO SOLN: 1.0000 | Freq: Once | ORAL | 0 refills | Status: AC

## 2022-07-12 NOTE — Progress Notes (Unsigned)
Chief Complaint: Abdominal pain, decreased appetite   Referring Provider:     Ellsworth Lennox, PA-C    HPI:     Catherine Adkins is a 66 y.o. female with a history of HTN, narcolepsy, chronic fatigue syndrome, fibromyalgia, referred to the Gastroenterology Clinic for evaluation of abdominal pain and decreased appetite.  Sxs started 1 month ago as LLQ pain, MEG pain, and decreased appetite. Tried increasing fluids, BRAT diet, etc, without improvement. Pain can radiated around to back, along with upper legs and arms.   Was seen in Urgent Care on 07/05/2022 for evaluation of abdominal pain (LLQ and MEG), nausea, intermittent BRBPR.  No improvement in abdominal pain with Naprosyn. - Normal CBC, CMP. No imaging.  - Discharged with Augmentin 875 mg bid for suspected diverticulitis - Discharged with Prevacid 30 mg bid, recommended stopping Naprosyn and instead prescribed Vicodin for couple days  No change in pain since completing Augmentin yetserday.   Has had change in bowel habits. Occasionl bleeding with dyschezia. Episodic rectla pain indepeneidnt of BM.   Reports a longstanding history of GERD which had been controlled with Nexium, but this was stopped when her insurance no longer covered.  Now taking Prevacid 30 gm BID and will use prn Tagamet OTC. Index sxs of HB, regurgitation.  +nocturnal sxs (regurgitation, choking).  No dysphagia.   No prior EGD, colonoscopy.   Mother and MGM with diverticulitis. PGM with Colon CA (but lived to 65 yo!).    Past Medical History:  Diagnosis Date   Chronic fatigue syndrome    Fibromyalgia    Hypertension    Narcolepsy    Narcolepsy      Past Surgical History:  Procedure Laterality Date   TONSILLECTOMY     Family History  Problem Relation Age of Onset   Diabetes Maternal Grandmother    Heart disease Maternal Grandmother    Colon cancer Paternal Grandmother    Esophageal cancer Neg Hx    Social History   Tobacco Use    Smoking status: Never   Smokeless tobacco: Never  Substance Use Topics   Alcohol use: Not Currently    Comment: rare   Drug use: Never   Current Outpatient Medications  Medication Sig Dispense Refill   Cimetidine (TAGAMET PO) Take 2 tablets by mouth daily.     gabapentin (NEURONTIN) 800 MG tablet      HYDROcodone-acetaminophen (NORCO/VICODIN) 5-325 MG tablet Take 1-2 tablets by mouth every 6 (six) hours as needed. 24 tablet 0   lansoprazole (PREVACID) 30 MG capsule Take 1 capsule (30 mg total) by mouth 2 (two) times daily before a meal. 60 capsule 0   olmesartan (BENICAR) 20 MG tablet Take 20 mg by mouth daily.     ondansetron (ZOFRAN-ODT) 4 MG disintegrating tablet Take 4 mg by mouth 4 (four) times daily.     rizatriptan (MAXALT) 10 MG tablet Take 10 mg by mouth daily.     sertraline (ZOLOFT) 50 MG tablet Take 50 mg by mouth daily.     SUMAtriptan (IMITREX) 5 MG/ACT nasal spray SMARTSIG:Both Nares     SUMAtriptan 6 MG/0.5ML SOAJ Inject into the muscle.     zolmitriptan (ZOMIG) 5 MG nasal solution 1 spray daily.     amoxicillin-clavulanate (AUGMENTIN) 875-125 MG tablet Take 1 tablet by mouth every 12 (twelve) hours. (Patient not taking: Reported on 07/12/2022) 14 tablet 0   benzonatate (TESSALON) 100 MG capsule Take 1  capsule (100 mg total) by mouth every 8 (eight) hours. (Patient not taking: Reported on 07/12/2022) 15 capsule 0   gabapentin (NEURONTIN) 600 MG tablet Take 600 mg by mouth 3 (three) times daily.     naproxen (NAPROSYN) 500 MG tablet Take 500 mg by mouth 2 (two) times daily. (Patient not taking: Reported on 07/12/2022)     No current facility-administered medications for this visit.   Allergies  Allergen Reactions   Azithromycin Swelling   Prednisone Shortness Of Breath and Rash     Review of Systems: All systems reviewed and negative except where noted in HPI.     Physical Exam:    Wt Readings from Last 3 Encounters:  11/07/21 200 lb (90.7 kg)    Ht 5\' 6"   (1.676 m)   LMP  (LMP Unknown)   BMI 32.28 kg/m  Constitutional:  Pleasant, in no acute distress. Psychiatric: Normal mood and affect. Behavior is normal. EENT: Pupils normal.  Conjunctivae are normal. No scleral icterus. Neck supple. No cervical LAD. Cardiovascular: Normal rate, regular rhythm. No edema Pulmonary/chest: Effort normal and breath sounds normal. No wheezing, rales or rhonchi. Abdominal: Soft, nondistended, nontender. Bowel sounds active throughout. There are no masses palpable. No hepatomegaly. Neurological: Alert and oriented to person place and time. Skin: Skin is warm and dry. No rashes noted.   ASSESSMENT AND PLAN;   1)  - EGD - Colonoscopy - CT abd/pelvis with contrast - BMP check     , DO, FACG  07/12/2022, 2:24 PM   09/11/2022, PA-C

## 2022-07-12 NOTE — Patient Instructions (Signed)
If you are age 65 or older, your body mass index should be between 23-30. Your Body mass index is 32.28 kg/m. If this is out of the aforementioned range listed, please consider follow up with your Primary Care Provider.  If you are age 22 or younger, your body mass index should be between 19-25. Your Body mass index is 32.28 kg/m. If this is out of the aformentioned range listed, please consider follow up with your Primary Care Provider.   __________________________________________________________  The [facility] providers would like to encourage you to use [facility] to communicate with providers for non-urgent requests or questions.  Due to long hold times on the telephone, sending your provider a message by [facility] may be a faster and more efficient way to get a response.  Please allow 48 business hours for a response.  Please remember that this is for non-urgent requests.   Due to recent changes in healthcare laws, you may see the results of your imaging and laboratory studies on MyChart before your provider has had a chance to review them.  We understand that in some cases there may be results that are confusing or concerning to you. Not all laboratory results come back in the same time frame and the provider may be waiting for multiple results in order to interpret others.  Please give [place] 48 hours in order for your provider to thoroughly review all the results before contacting the office for clarification of your results.   Your provider has requested that you go to the basement level for lab work before leaving today. Press "B" on the elevator. The lab is located at the first door on the left as you exit the elevator.   You will be contacted by [facility] in the next 2 days to arrange a CT.  The number on your caller ID will be [phone], please answer when they call.  If you have not heard from them in 2 days please call [phone] to schedule.      You have been  scheduled for a CT scan of the abdomen and pelvis at [facility], 1st floor Radiology. You are scheduled on _______ at _______. You should arrive 15 minutes prior to your appointment time for registration.  We are giving you 2 bottles of contrast today that you will need to drink before arriving for the exam. The solution may taste better if refrigerated so put them in the refrigerator when you get home, but do NOT add ice or any other liquid to this solution as that would dilute it. Shake well before drinking.   Please follow the written instructions below on the day of your exam:   1) Do not eat anything after _____ (4 hours prior to your test)   2) Drink 1 bottle of contrast @ ______ (2 hours prior to your exam)  Remember to shake well before drinking and do NOT pour over ice.     Drink 1 bottle of contrast @ ______ (1 hour prior to your exam)   You may take any medications as prescribed with a small amount of water, if necessary. If you take any of the following medications: METFORMIN, GLUCOPHAGE, GLUCOVANCE, AVANDAMET, RIOMET, FORTAMET, [place] MET, JANUMET, GLUMETZA or METAGLIP, you MAY be asked to HOLD this medication 48 hours AFTER the exam.   The purpose of you drinking the oral contrast is to aid in the visualization of your intestinal tract. The contrast solution may cause some diarrhea.  Depending on your individual set of symptoms, you may also receive an intravenous injection of x-ray contrast/dye. Plan on being at [facility] for 45 minutes or longer, depending on the type of exam you are having performed.   If you have any questions regarding your exam or if you need to reschedule, you may call [facility] at [phone] between the hours of 8:00 am and 5:00 pm, Monday-Friday.    We have sent the following medications to your pharmacy for you to pick up at your convenience: Clenpiq     ________________________________________________________________________     Thank you for choosing me and [facility].  Vito Cirigliano, D.O.

## 2022-07-22 ENCOUNTER — Ambulatory Visit (HOSPITAL_COMMUNITY)
Admission: RE | Admit: 2022-07-22 | Discharge: 2022-07-22 | Disposition: A | Discharge status: Home / Self Care | Payer: Medicare Other | Source: Ambulatory Visit | Attending: Gastroenterology | Admitting: Gastroenterology

## 2022-07-22 ENCOUNTER — Encounter (HOSPITAL_COMMUNITY): Payer: Self-pay

## 2022-07-22 DIAGNOSIS — R1013 Epigastric pain: Secondary | ICD-10-CM | POA: Diagnosis present

## 2022-07-22 DIAGNOSIS — K921 Melena: Secondary | ICD-10-CM | POA: Diagnosis present

## 2022-07-22 DIAGNOSIS — R1032 Left lower quadrant pain: Secondary | ICD-10-CM | POA: Diagnosis present

## 2022-07-22 DIAGNOSIS — K219 Gastro-esophageal reflux disease without esophagitis: Secondary | ICD-10-CM | POA: Diagnosis present

## 2022-07-22 MED ORDER — IOHEXOL 300 MG/ML  SOLN
100.0000 mL | Freq: Once | INTRAMUSCULAR | Status: AC | PRN
  Administered 2022-07-22: 100 mL via INTRAVENOUS

## 2022-07-22 MED ORDER — SODIUM CHLORIDE (PF) 0.9 % IJ SOLN
INTRAMUSCULAR | Status: AC
  Filled 2022-07-22: qty 50

## 2022-08-03 ENCOUNTER — Other Ambulatory Visit: Payer: Self-pay

## 2022-08-03 MED ORDER — LANSOPRAZOLE 30 MG PO CPDR: 30.0000 mg | DELAYED_RELEASE_CAPSULE | Freq: Two times a day (BID) | ORAL | 4 refills | Status: AC

## 2022-08-09 ENCOUNTER — Encounter: Payer: Self-pay | Admitting: Gastroenterology

## 2022-08-09 ENCOUNTER — Ambulatory Visit (AMBULATORY_SURGERY_CENTER): Payer: Medicare Other | Admitting: Gastroenterology

## 2022-08-09 VITALS — BP 127/74 | HR 65 | Temp 96.6°F | Resp 12 | Ht 66.0 in | Wt 178.0 lb

## 2022-08-09 DIAGNOSIS — R1032 Left lower quadrant pain: Secondary | ICD-10-CM | POA: Diagnosis not present

## 2022-08-09 DIAGNOSIS — R194 Change in bowel habit: Secondary | ICD-10-CM

## 2022-08-09 DIAGNOSIS — K640 First degree hemorrhoids: Secondary | ICD-10-CM

## 2022-08-09 DIAGNOSIS — R1013 Epigastric pain: Secondary | ICD-10-CM | POA: Diagnosis not present

## 2022-08-09 DIAGNOSIS — K6289 Other specified diseases of anus and rectum: Secondary | ICD-10-CM | POA: Diagnosis not present

## 2022-08-09 DIAGNOSIS — K921 Melena: Secondary | ICD-10-CM | POA: Diagnosis not present

## 2022-08-09 DIAGNOSIS — K219 Gastro-esophageal reflux disease without esophagitis: Secondary | ICD-10-CM

## 2022-08-09 DIAGNOSIS — K319 Disease of stomach and duodenum, unspecified: Secondary | ICD-10-CM | POA: Diagnosis not present

## 2022-08-09 MED ORDER — SODIUM CHLORIDE 0.9 % IV SOLN: 500.0000 mL | Freq: Once | INTRAVENOUS | Status: DC

## 2022-08-09 NOTE — Progress Notes (Signed)
Sedate, gd SR, tolerated procedure well, VSS, report to RN 

## 2022-08-09 NOTE — Patient Instructions (Addendum)
  UPPER ENDOSCOPY: Await gastric biopsy results   COLONOSCOPY: Handout on hemorrhoids given to you    YOU HAD AN ENDOSCOPIC PROCEDURE TODAY AT [facility]:   Refer to the procedure report that was given to you for any specific questions about what was found during the examination.  If the procedure report does not answer your questions, please call your gastroenterologist to clarify.  If you requested that your care partner not be given the details of your procedure findings, then the procedure report has been included in a sealed envelope for you to review at your convenience later.  YOU SHOULD EXPECT: Some feelings of bloating in the abdomen. Passage of more gas than usual.  Walking can help get rid of the air that was put into your GI tract during the procedure and reduce the bloating. If you had a lower endoscopy (such as a colonoscopy or flexible sigmoidoscopy) you may notice spotting of blood in your stool or on the toilet paper. If you underwent a bowel prep for your procedure, you may not have a normal bowel movement for a few days.  Please Note:  You might notice some irritation and congestion in your nose or some drainage.  This is from the oxygen used during your procedure.  There is no need for concern and it should clear up in a day or so.  SYMPTOMS TO REPORT IMMEDIATELY:  Following lower endoscopy (colonoscopy or flexible sigmoidoscopy):  Excessive amounts of blood in the stool  Significant tenderness or worsening of abdominal pains  Swelling of the abdomen that is new, acute  Fever of 100F or higher  Following upper endoscopy (EGD)  Vomiting of blood or coffee ground material  New chest pain or pain under the shoulder blades  Painful or persistently difficult swallowing  New shortness of breath  Fever of 100F or higher  Black, tarry-looking stools  For urgent or emergent issues, a gastroenterologist can be reached at any hour by calling [phone]  [phone]. Do not use MyChart messaging for urgent concerns.    DIET:  We do recommend a small meal at first, but then you may proceed to your regular diet.  Drink plenty of fluids but you should avoid alcoholic beverages for 24 hours.  ACTIVITY:  You should plan to take it easy for the rest of today and you should NOT DRIVE or use heavy machinery until tomorrow (because of the sedation medicines used during the test).    FOLLOW UP: Our staff will call the number listed on your records the next business day following your procedure.  We will call around 7:15- 8:00 am to check on you and address any questions or concerns that you may have regarding the information given to you following your procedure. If we do not reach you, we will leave a message.     If any biopsies were taken you will be contacted by phone or by letter within the next 1-3 weeks.  Please call us at [phone] if you have not heard about the biopsies in 3 weeks.    SIGNATURES/CONFIDENTIALITY: You and/or your care partner have signed paperwork which will be entered into your electronic medical record.  These signatures attest to the fact that that the information above on your After Visit Summary has been reviewed and is understood.  Full responsibility of the confidentiality of this discharge information lies with you and/or your care-partner.

## 2022-08-09 NOTE — Op Note (Signed)
[facility] Patient Name: Catherine Adkins Procedure Date: 08/09/2022 2:32 PM MRN: [id number] Endoscopist: Gerrit Heck , MD Age: 66 Referring MD:  Date of Birth: Jun 19, 1956 Gender: Female Account #: [id number] Procedure:                Upper GI endoscopy Indications:              Epigastric abdominal pain, Heartburn, Suspected                            esophageal reflux, Decreased appetite Medicines:                Monitored Anesthesia Care Procedure:                Pre-Anesthesia Assessment:                           - Prior to the procedure, a History and Physical                            was performed, and patient medications and                            allergies were reviewed. The patient's tolerance of                            previous anesthesia was also reviewed. The risks                            and benefits of the procedure and the sedation                            options and risks were discussed with the patient.                            All questions were answered, and informed consent                            was obtained. Prior Anticoagulants: The patient has                            taken no previous anticoagulant or antiplatelet                            agents. ASA Grade Assessment: II - A patient with                            mild systemic disease. After reviewing the risks                            and benefits, the patient was deemed in                            satisfactory condition to undergo the procedure.  After obtaining informed consent, the endoscope was                            passed under direct vision. Throughout the                            procedure, the patient's blood pressure, pulse, and                            oxygen saturations were monitored continuously. The                            Olympus Scope [id number] was introduced through the                            mouth, and  advanced to the second part of duodenum.                            The upper GI endoscopy was accomplished without                            difficulty. The patient tolerated the procedure                            well. Scope In: Scope Out: Findings:                 The examined esophagus was normal.                           Evidence of a prior fundoplication was found in the                            cardia. Appearance most suggestive of a Nissen                            fundoplication. The wrap appeared mostly intact.                            This was traversed. The LES was appropriately tight                            around the endoscope.                           The gastric fundus, gastric body, incisura and                            gastric antrum were normal. Biopsies were taken                            with a cold forceps for Helicobacter pylori                            testing. Estimated blood loss was minimal.  The examined duodenum was normal. Complications:            No immediate complications. Estimated Blood Loss:     Estimated blood loss was minimal. Impression:               - Normal esophagus.                           - Evidence of a prior fundoplication was found. The                            wrap appears intact.                           - Normal gastric fundus, gastric body, incisura and                            antrum. Biopsied.                           - Normal examined duodenum. Recommendation:           - Patient has a contact number available for                            emergencies. The signs and symptoms of potential                            delayed complications were discussed with the                            patient. Return to normal activities tomorrow.                            Written discharge instructions were provided to the                            patient.                           - Resume  previous diet.                           - Continue present medications.                           - Await pathology results.                           - Perform a colonoscopy today. Doristine Locks, MD 08/09/2022 3:17:59 PM

## 2022-08-09 NOTE — Op Note (Signed)
[facility] Patient Name: Catherine Adkins Procedure Date: 08/09/2022 2:31 PM MRN: [id number] Endoscopist: Doristine Locks , MD Age: 66 Referring MD:  Date of Birth: December 23, 1955 Gender: Female Account #: [id number] Procedure:                Colonoscopy Indications:              Abdominal pain in the left lower quadrant,                            Hematochezia, Rectal pain                           Separately, also due to colon cancer screening                            colonoscopy. Medicines:                Monitored Anesthesia Care Procedure:                Pre-Anesthesia Assessment:                           - Prior to the procedure, a History and Physical                            was performed, and patient medications and                            allergies were reviewed. The patient's tolerance of                            previous anesthesia was also reviewed. The risks                            and benefits of the procedure and the sedation                            options and risks were discussed with the patient.                            All questions were answered, and informed consent                            was obtained. Prior Anticoagulants: The patient has                            taken no previous anticoagulant or antiplatelet                            agents. ASA Grade Assessment: II - A patient with                            mild systemic disease. After reviewing the risks  and benefits, the patient was deemed in                            satisfactory condition to undergo the procedure.                           After obtaining informed consent, the colonoscope                            was passed under direct vision. Throughout the                            procedure, the patient's blood pressure, pulse, and                            oxygen saturations were monitored continuously. The                             Olympus [id number]) Colonoscope was                            introduced through the anus and advanced to the the                            terminal ileum. The colonoscopy was performed                            without difficulty. The patient tolerated the                            procedure well. The quality of the bowel                            preparation was excellent. The terminal ileum,                            ileocecal valve, appendiceal orifice, and rectum                            were photographed. Scope In: 2:51:03 PM Scope Out: 3:09:13 PM Scope Withdrawal Time: 0 hours 10 minutes 16 seconds  Total Procedure Duration: 0 hours 18 minutes 10 seconds  Findings:                 The perianal and digital rectal examinations were                            normal.                           The entire colon appeared normal.                           Non-bleeding internal hemorrhoids were found during  retroflexion. The hemorrhoids were small and Grade                            I (internal hemorrhoids that do not prolapse). Complications:            No immediate complications. Estimated Blood Loss:     Estimated blood loss: none. Impression:               - The entire examined colon is normal.                           - Non-bleeding internal hemorrhoids.                           - No specimens collected. Recommendation:           - Patient has a contact number available for                            emergencies. The signs and symptoms of potential                            delayed complications were discussed with the                            patient. Return to normal activities tomorrow.                            Written discharge instructions were provided to the                            patient.                           - Resume previous diet.                           - Continue present medications.                            - Repeat colonoscopy in 10 years for screening                            purposes.                           - Return to GI office PRN. Gerrit Heck, MD 08/09/2022 3:22:31 PM

## 2022-08-09 NOTE — Progress Notes (Signed)
Called to room to assist during endoscopic procedure.  Patient ID and intended procedure confirmed with present staff. Received instructions for my participation in the procedure from the performing physician.  

## 2022-08-09 NOTE — Progress Notes (Signed)
GASTROENTEROLOGY PROCEDURE H&P NOTE   Primary Care Physician: Lin Landsman, MD    Reason for Procedure:  Abdominal pain, decreased appetite, change in bowel habits, hematochezia, rectal pain, GERD  Plan:    EGD, colonoscopy  Patient is appropriate for endoscopic procedure(s) in the ambulatory ([facility]) setting.  The nature of the procedure, as well as the risks, benefits, and alternatives were carefully and thoroughly reviewed with the patient. Ample time for discussion and questions allowed. The patient understood, was satisfied, and agreed to proceed.     HPI: Catherine Adkins is a 66 y.o. female who presents for EGD and colonoscopy for evaluation of multiple GI symptoms to include LLQ pain, hematochezia, to MiraLAX, dyschezia/rectal pain, GERD, abdominal pain/epigastric pain.  Patient was most recently seen in the Gastroenterology Clinic on 07/12/2022 by me.  No interval change in medical history since that appointment. Please refer to that note for full details regarding GI history and clinical presentation.   Past Medical History:  Diagnosis Date   Chronic fatigue syndrome    Fibromyalgia    Hypertension    Narcolepsy    Narcolepsy     Past Surgical History:  Procedure Laterality Date   TONSILLECTOMY      Prior to Admission medications   Medication Sig Start Date End Date Taking? Authorizing Provider  gabapentin (NEURONTIN) 800 MG tablet 800 mg 3 (three) times daily. 05/07/21  Yes [provider]  HYDROcodone-acetaminophen (NORCO/VICODIN) 5-325 MG tablet Take 1-2 tablets by mouth every 6 (six) hours as needed. 07/05/22  Yes Nyoka Lint, PA-C  lansoprazole (PREVACID) 30 MG capsule Take 1 capsule (30 mg total) by mouth 2 (two) times daily before a meal. 08/03/22  Yes Kendarius Vigen V, DO  olmesartan-hydrochlorothiazide (BENICAR HCT) 40-12.5 MG tablet Take 1 tablet by mouth daily. 07/06/22  Yes [provider]  ondansetron (ZOFRAN-ODT) 4 MG disintegrating  tablet Take 4 mg by mouth 4 (four) times daily. 04/06/22  Yes [provider]  sertraline (ZOLOFT) 50 MG tablet Take 50 mg by mouth daily.   Yes [provider]  SUMAtriptan Dellis Filbert) 5 MG/ACT nasal spray SMARTSIG:Both Nares 07/02/22  Yes [provider]  SUMAtriptan 6 MG/0.5ML SOAJ Inject into the muscle. 06/08/22  Yes [provider]  Cimetidine (TAGAMET PO) Take 2 tablets by mouth daily. Patient not taking: Reported on 08/09/2022    [provider]  rizatriptan (MAXALT) 10 MG tablet Take 10 mg by mouth daily. 04/06/22   [provider]  zolmitriptan (ZOMIG) 5 MG nasal solution 1 spray daily. 04/06/22   [provider]    Current Outpatient Medications  Medication Sig Dispense Refill   gabapentin (NEURONTIN) 800 MG tablet 800 mg 3 (three) times daily.     HYDROcodone-acetaminophen (NORCO/VICODIN) 5-325 MG tablet Take 1-2 tablets by mouth every 6 (six) hours as needed. 24 tablet 0   lansoprazole (PREVACID) 30 MG capsule Take 1 capsule (30 mg total) by mouth 2 (two) times daily before a meal. 180 capsule 4   olmesartan-hydrochlorothiazide (BENICAR HCT) 40-12.5 MG tablet Take 1 tablet by mouth daily.     ondansetron (ZOFRAN-ODT) 4 MG disintegrating tablet Take 4 mg by mouth 4 (four) times daily.     sertraline (ZOLOFT) 50 MG tablet Take 50 mg by mouth daily.     SUMAtriptan (IMITREX) 5 MG/ACT nasal spray SMARTSIG:Both Nares     SUMAtriptan 6 MG/0.5ML SOAJ Inject into the muscle.     Cimetidine (TAGAMET PO) Take 2 tablets by mouth daily. (  Patient not taking: Reported on 08/09/2022)     rizatriptan (MAXALT) 10 MG tablet Take 10 mg by mouth daily.     zolmitriptan (ZOMIG) 5 MG nasal solution 1 spray daily.     Current Facility-Administered Medications  Medication Dose Route Frequency Provider Last Rate Last Admin   0.9 %  sodium chloride infusion  500 mL Intravenous Once Cerenity Goshorn V, DO        Allergies as of 08/09/2022 - Review  Complete 08/09/2022  Allergen Reaction Noted   Azithromycin Swelling 11/07/2021   Prednisone Shortness Of Breath and Rash 12/07/2018    Family History  Problem Relation Age of Onset   Diabetes Maternal Grandmother    Heart disease Maternal Grandmother    Colon cancer Paternal Grandmother    Esophageal cancer Neg Hx    Rectal cancer Neg Hx    Stomach cancer Neg Hx     Social History   Socioeconomic History   Marital status: Single    Spouse name: Not on file   Number of children: 0   Years of education: Not on file   Highest education level: Not on file  Occupational History   Not on file  Tobacco Use   Smoking status: Never   Smokeless tobacco: Never  Vaping Use   Vaping Use: Never used  Substance and Sexual Activity   Alcohol use: Not Currently    Comment: rare   Drug use: Never   Sexual activity: Not on file  Other Topics Concern   Not on file  Social History Narrative   Not on file   Social Determinants of Health   Financial Resource Strain: Not on file  Food Insecurity: Not on file  Transportation Needs: Not on file  Physical Activity: Not on file  Stress: Not on file  Social Connections: Not on file  Intimate Partner Violence: Not on file    Physical Exam: Vital signs in last 24 hours: @BP  129/76   Pulse 68   Temp (!) 96.6 F (35.9 C) (Temporal)   Ht 5\' 6"  (1.676 m)   Wt 178 lb (80.7 kg)   LMP  (LMP Unknown)   SpO2 99%   BMI 28.73 kg/m  GEN: NAD EYE: Sclerae anicteric ENT: MMM CV: Non-tachycardic Pulm: CTA b/l GI: Soft, NT/ND NEURO:  Alert & Oriented x 3   , DO [facility]   08/09/2022 2:24 PM

## 2022-08-10 ENCOUNTER — Telehealth: Payer: Self-pay

## 2022-08-10 NOTE — Telephone Encounter (Signed)
I placed several calls to her today, no answer.  Left voicemail.  Called again this afternoon and still no answer.  I will try again tomorrow.

## 2022-08-10 NOTE — Telephone Encounter (Signed)
  Follow up Call-     08/09/2022    1:46 PM  Call back number  Post procedure Call Back phone  # [phone]  Permission to leave phone message Yes     Patient questions:  Do you have a fever, pain , or abdominal swelling? No. Pain Score  0 *  Have you tolerated food without any problems? Yes.    Have you been able to return to your normal activities? Yes.    Do you have any questions about your discharge instructions: Diet   No. Medications  No. Follow up visit  No.  Do you have questions or concerns about your Care? Yes.    Actions: * If pain score is 4 or above: No action needed, pain <4. Patient is concerned about the pictures and the mention that she has had a fundiplication.  Patient states that she has never had that procedure and is questioning the fact that Dr. Bryan Lemma has evidence of that in her report.  She would like to understand the fact that there is evidence of this in her report and would like to speak with Dr. Bryan Lemma.

## 2022-08-11 ENCOUNTER — Telehealth: Payer: Self-pay | Admitting: *Deleted

## 2022-08-11 NOTE — Telephone Encounter (Signed)
  Follow up Call-     08/09/2022    1:46 PM  Call back number  Post procedure Call Back phone  # [phone]  Permission to leave phone message Yes     Patient questions:  Do you have a fever, pain , or abdominal swelling? No. Pain Score  0 *  Have you tolerated food without any problems? Yes.    Have you been able to return to your normal activities? Yes.    Do you have any questions about your discharge instructions: Diet   No. Medications  No. Follow up visit  No.  Do you have questions or concerns about your Care? No.  Actions: * If pain score is 4 or above: No action needed, pain <4.

## 2022-08-11 NOTE — Telephone Encounter (Signed)
I spoke with the patient today.  She reiterates that she has never had any intra abdominal surgery before, to include fundoplication.  Reviewed images.  Possibly some very prominent sling fiber muscles in the gastric fundus/cardia, but intact LES.  Reassurance provided.  She was appreciative of the phone call and explanation.

## 2023-05-24 ENCOUNTER — Other Ambulatory Visit: Payer: Self-pay | Admitting: Family Medicine

## 2023-05-24 DIAGNOSIS — Z1231 Encounter for screening mammogram for malignant neoplasm of breast: Secondary | ICD-10-CM

## 2023-06-11 ENCOUNTER — Emergency Department (HOSPITAL_BASED_OUTPATIENT_CLINIC_OR_DEPARTMENT_OTHER)
Admission: EM | Admit: 2023-06-11 | Discharge: 2023-06-11 | Disposition: A | Discharge status: Home / Self Care | Payer: Medicare Other | Attending: Emergency Medicine | Admitting: Emergency Medicine

## 2023-06-11 ENCOUNTER — Emergency Department (HOSPITAL_BASED_OUTPATIENT_CLINIC_OR_DEPARTMENT_OTHER): Payer: Medicare Other

## 2023-06-11 ENCOUNTER — Encounter (HOSPITAL_BASED_OUTPATIENT_CLINIC_OR_DEPARTMENT_OTHER): Payer: Self-pay | Admitting: Emergency Medicine

## 2023-06-11 DIAGNOSIS — D649 Anemia, unspecified: Secondary | ICD-10-CM | POA: Insufficient documentation

## 2023-06-11 DIAGNOSIS — G47411 Narcolepsy with cataplexy: Secondary | ICD-10-CM | POA: Insufficient documentation

## 2023-06-11 DIAGNOSIS — M549 Dorsalgia, unspecified: Secondary | ICD-10-CM | POA: Diagnosis not present

## 2023-06-11 DIAGNOSIS — R4182 Altered mental status, unspecified: Secondary | ICD-10-CM | POA: Diagnosis not present

## 2023-06-11 DIAGNOSIS — R21 Rash and other nonspecific skin eruption: Secondary | ICD-10-CM | POA: Diagnosis present

## 2023-06-11 DIAGNOSIS — M797 Fibromyalgia: Secondary | ICD-10-CM

## 2023-06-11 DIAGNOSIS — G9332 Myalgic encephalomyelitis/chronic fatigue syndrome: Secondary | ICD-10-CM | POA: Insufficient documentation

## 2023-06-11 DIAGNOSIS — I10 Essential (primary) hypertension: Secondary | ICD-10-CM | POA: Diagnosis not present

## 2023-06-11 DIAGNOSIS — Z79899 Other long term (current) drug therapy: Secondary | ICD-10-CM | POA: Insufficient documentation

## 2023-06-11 LAB — COMPREHENSIVE METABOLIC PANEL
ALT: 23 U/L (ref 0–44)
AST: 25 U/L (ref 15–41)
Albumin: 4.7 g/dL (ref 3.5–5.0)
Alkaline Phosphatase: 32 U/L — ABNORMAL LOW (ref 38–126)
Anion gap: 15 (ref 5–15)
BUN: 22 mg/dL (ref 8–23)
CO2: 24 mmol/L (ref 22–32)
Calcium: 10 mg/dL (ref 8.9–10.3)
Chloride: 97 mmol/L — ABNORMAL LOW (ref 98–111)
Creatinine, Ser: 1.29 mg/dL — ABNORMAL HIGH (ref 0.44–1.00)
GFR, Estimated: 46 mL/min — ABNORMAL LOW (ref >60)
Glucose, Bld: 127 mg/dL — ABNORMAL HIGH (ref 70–99)
Potassium: 3.9 mmol/L (ref 3.5–5.1)
Sodium: 136 mmol/L (ref 135–145)
Total Bilirubin: 0.8 mg/dL (ref 0.3–1.2)
Total Protein: 6.9 g/dL (ref 6.5–8.1)

## 2023-06-11 LAB — CBC WITH DIFFERENTIAL/PLATELET
Abs Immature Granulocytes: 0.01 10*3/uL (ref 0.00–0.07)
Basophils Absolute: 0 10*3/uL (ref 0.0–0.1)
Basophils Relative: 0 %
Eosinophils Absolute: 0 10*3/uL (ref 0.0–0.5)
Eosinophils Relative: 0 %
HCT: 33.4 % — ABNORMAL LOW (ref 36.0–46.0)
Hemoglobin: 11.4 g/dL — ABNORMAL LOW (ref 12.0–15.0)
Immature Granulocytes: 0 %
Lymphocytes Relative: 15 %
Lymphs Abs: 0.8 10*3/uL (ref 0.7–4.0)
MCH: 30.2 pg (ref 26.0–34.0)
MCHC: 34.1 g/dL (ref 30.0–36.0)
MCV: 88.4 fL (ref 80.0–100.0)
Monocytes Absolute: 0.3 10*3/uL (ref 0.1–1.0)
Monocytes Relative: 5 %
Neutro Abs: 4.2 10*3/uL (ref 1.7–7.7)
Neutrophils Relative %: 80 %
Platelets: 188 10*3/uL (ref 150–400)
RBC: 3.78 MIL/uL — ABNORMAL LOW (ref 3.87–5.11)
RDW: 12.8 % (ref 11.5–15.5)
WBC: 5.3 10*3/uL (ref 4.0–10.5)
nRBC: 0 % (ref 0.0–0.2)

## 2023-06-11 LAB — TROPONIN I (HIGH SENSITIVITY): Troponin I (High Sensitivity): 7 ng/L (ref <18)

## 2023-06-11 LAB — LIPASE, BLOOD: Lipase: 46 U/L (ref 11–51)

## 2023-06-11 MED ORDER — SODIUM CHLORIDE 0.9 % IV BOLUS
500.0000 mL | Freq: Once | INTRAVENOUS | Status: AC
  Administered 2023-06-11: 500 mL via INTRAVENOUS

## 2023-06-11 NOTE — Discharge Instructions (Addendum)
It was a pleasure caring for you today.  You need to follow-up with your neurologist and rheumatologist.  Seek emergency care if experiencing any new or worsening symptoms.

## 2023-06-11 NOTE — ED Triage Notes (Signed)
Brought by EMS. Multiple complaints,  "Red spots" on body. Generalized pain EMS VS 190/100 94hr 97% RA

## 2023-06-11 NOTE — ED Provider Notes (Signed)
[place] EMERGENCY DEPARTMENT AT [facility] Provider Note   CSN: [id number] Arrival date & time: 06/11/23  1637     History  Chief Complaint  Patient presents with   Rash   Back Pain    Catherine Adkins is a 67 y.o. female HTN, severe fibromyalgia, chronic fatigue syndrome, narcolepsy, cataplexy who presents to ED concerned for rash, bruising, tremors, confusion and generalized pain since Thursday night. States that it took 3 hours to make coffee and states that she has been talking to the wall.  Has been taking clarithromycin since Tuesday for sinusitis and wonders if this could be related to her symptoms. Also concerned for RMSF given that she works in yard but does not remember tick bite.   Denies fever, dyspnea, cough, nausea, vomiting, dysuria, hematuria. Denies liver disease or chronic alcohol use - 2 drinks per year.   Rash Back Pain      Home Medications Prior to Admission medications   Medication Sig Start Date End Date Taking? Authorizing Provider  Cimetidine (TAGAMET PO) Take 2 tablets by mouth daily. Patient not taking: Reported on 08/09/2022    [provider]  gabapentin (NEURONTIN) 800 MG tablet 800 mg 3 (three) times daily. 05/07/21   [provider]  HYDROcodone-acetaminophen (NORCO/VICODIN) 5-325 MG tablet Take 1-2 tablets by mouth every 6 (six) hours as needed. 07/05/22   Ellsworth Lennox, PA-C  lansoprazole (PREVACID) 30 MG capsule Take 1 capsule (30 mg total) by mouth 2 (two) times daily before a meal. 08/03/22   Cirigliano, Vito V, DO  olmesartan-hydrochlorothiazide (BENICAR HCT) 40-12.5 MG tablet Take 1 tablet by mouth daily. 07/06/22   [provider]  ondansetron (ZOFRAN-ODT) 4 MG disintegrating tablet Take 4 mg by mouth 4 (four) times daily. 04/06/22   [provider]  rizatriptan (MAXALT) 10 MG tablet Take 10 mg by mouth daily. 04/06/22   [provider]  sertraline (ZOLOFT) 50 MG tablet Take 50 mg by mouth  daily.    [provider]  SUMAtriptan Willette Brace) 5 MG/ACT nasal spray SMARTSIG:Both Nares 07/02/22   [provider]  SUMAtriptan 6 MG/0.5ML SOAJ Inject into the muscle. 06/08/22   [provider]  zolmitriptan (ZOMIG) 5 MG nasal solution 1 spray daily. 04/06/22   [provider]      Allergies    Azithromycin and Prednisone    Review of Systems   Review of Systems  Musculoskeletal:  Positive for back pain.  Skin:  Positive for rash.    Physical Exam Updated Vital Signs BP 139/74   Pulse 100   Temp 98.8 F (37.1 C) (Oral)   Resp 19   LMP  (LMP Unknown)   SpO2 97%  Physical Exam Vitals and nursing note reviewed.  Constitutional:      General: She is not in acute distress.    Appearance: She is not ill-appearing or toxic-appearing.  HENT:     Head: Normocephalic and atraumatic.     Mouth/Throat:     Mouth: Mucous membranes are moist.  Eyes:     General: No scleral icterus.       Right eye: No discharge.        Left eye: No discharge.     Conjunctiva/sclera: Conjunctivae normal.  Cardiovascular:     Rate and Rhythm: Normal rate and regular rhythm.     Pulses: Normal pulses.     Heart sounds: Normal heart sounds. No murmur heard. Pulmonary:     Effort: Pulmonary effort is  normal. No respiratory distress.     Breath sounds: Normal breath sounds. No wheezing, rhonchi or rales.  Abdominal:     General: Abdomen is flat. Bowel sounds are normal.     Palpations: Abdomen is soft.     Tenderness: There is no abdominal tenderness.  Musculoskeletal:     Right lower leg: No edema.     Left lower leg: No edema.     Comments: Patient with minor shaking of hands that she shows me while holding arms out extended. No ataxia with normal movement of arms and legs during conversation. +2 pedal and radial pulses.  Skin:    General: Skin is warm and dry.     Capillary Refill: Capillary refill takes less than 2 seconds.     Findings: No rash.      Comments: Few small (<0.5cm) scattered scabs along bilateral ankles appear clean, dry and intact without bleeding. No rash between fingers. No swelling, surrounding erythema, or purulence. No bruising. Negative Nikolsky.  Neurological:     General: No focal deficit present.     Mental Status: She is alert and oriented to person, place, and time. Mental status is at baseline.  Psychiatric:        Mood and Affect: Mood normal.     ED Results / Procedures / Treatments   Labs (all labs ordered are listed, but only abnormal results are displayed) Labs Reviewed  COMPREHENSIVE METABOLIC PANEL - Abnormal; Notable for the following components:      Result Value   Chloride 97 (*)    Glucose, Bld 127 (*)    Creatinine, Ser 1.29 (*)    Alkaline Phosphatase 32 (*)    GFR, Estimated 46 (*)    All other components within normal limits  CBC WITH DIFFERENTIAL/PLATELET - Abnormal; Notable for the following components:   RBC 3.78 (*)    Hemoglobin 11.4 (*)    HCT 33.4 (*)    All other components within normal limits  LIPASE, BLOOD  CBG MONITORING, ED  TROPONIN I (HIGH SENSITIVITY)    EKG EKG Interpretation Date/Time:  Sunday June 11 2023 18:54:57 EDT Ventricular Rate:  110 PR Interval:  158 QRS Duration:  67 QT Interval:  323 QTC Calculation: 437 R Axis:   47  Text Interpretation: been like multivitamin Sinus tachycardia Low voltage, precordial leads Abnormal R-wave progression, early transition Minimal ST depression, inferior leads Minimal ST elevation, lateral leads Confirmed by Benjiman Core [phone]) on 06/11/2023 7:54:25 PM  Radiology DG Chest Portable 1 View  Result Date: 06/11/2023 CLINICAL DATA:  Chest pain EXAM: PORTABLE CHEST 1 VIEW COMPARISON:  12/07/2018 FINDINGS: The heart size and mediastinal contours are within normal limits. Both lungs are clear. The visualized skeletal structures are unremarkable. IMPRESSION: No active disease. Electronically Signed   By: Charlett Nose  M.D.   On: 06/11/2023 18:35   CT Head Wo Contrast  Result Date: 06/11/2023 CLINICAL DATA:  Mental status change, unknown cause EXAM: CT HEAD WITHOUT CONTRAST TECHNIQUE: Contiguous axial images were obtained from the base of the skull through the vertex without intravenous contrast. RADIATION DOSE REDUCTION: This exam was performed according to the departmental dose-optimization program which includes automated exposure control, adjustment of the mA and/or kV according to patient size and/or use of iterative reconstruction technique. COMPARISON:  None available. FINDINGS: Brain: No intracranial hemorrhage, mass effect, or midline shift. No hydrocephalus. The basilar cisterns are patent. No evidence of territorial infarct or acute ischemia. No extra-axial or intracranial fluid  collection. Vascular: No hyperdense vessel or unexpected calcification. Skull: No fracture or focal lesion. Sinuses/Orbits: Paranasal sinuses and mastoid air cells are clear. The visualized orbits are unremarkable. Other: None. IMPRESSION: Negative noncontrast head CT. Electronically Signed   By: Narda Rutherford M.D.   On: 06/11/2023 18:14    Procedures Procedures    Medications Ordered in ED Medications  sodium chloride 0.9 % bolus 500 mL (500 mLs Intravenous New Bag/Given 06/11/23 2026)    ED Course/ Medical Decision Making/ A&P                                 Medical Decision Making Amount and/or Complexity of Data Reviewed Labs: ordered. Radiology: ordered.   This patient presents to the ED for concern of Multiple complaints including rash, confusion, generalized pain, this involves an extensive number of treatment options, and is a complaint that carries with it a high risk of complications and morbidity.  The differential diagnosis includes irritant contact dermatitis, DRESS, atopic dermatitis, anaphylaxis, SJS/TEN, liver failure, vitamin deficiency   Co morbidities that complicate the patient evaluation  HTN,  fibromyalgia, chronic fatigue syndrome   Lab Tests:  I Ordered, and personally interpreted labs.  The pertinent results include:   -troponin: within normal limits - lipase: within normal limits - CBC mild anema; no leukocytosis - CMP: mildly elevated Cr - patient stating that it is baseline for her   Imaging Studies ordered:  I ordered imaging studies including  -CT head: To assess for process contributing to patient's symptoms -Chest x-ray: To assess for process contributing to patient's symptoms I independently visualized and interpreted imaging I agree with the radiologist interpretation   Cardiac Monitoring: / EKG:  The patient was maintained on a cardiac monitor.  I personally viewed and interpreted the cardiac monitored which showed an underlying rhythm of: Tachycardia without acute ST changes or arrhythmias   Problem List / ED Course / Critical interventions / Medication management  Patient presents emergency room concern for multiple complaints including rash, confusion, tremors, generalized pain.  Physical exam and neuroexam unremarkable.  Patient AAOx4.  A few, very small scabs present on bilateral ankles without swelling, surrounding erythema, or purulence. Negative nikolsky sign. Patient with mild tremors when hands are held out straight but no ataxia of UE or LE when moving during conversation. patient with mild tachycardia which improved with IV fluids.  Patient afebrile with stable vitals. Troponin within normal limits.  Lipase within normal limits.  CMP with mildly elevated creatinine which patient says is baseline for her and she was receiving follow-up for this.  CBC with mild anemia and no leukocytosis.  Chest x-ray showing no acute cardiopulmonary disease.  CT head without intracranial abnormalities.  EKG with some tachycardia but no ST changes or arrhythmias. Shared results with patient.  Recommended patient follow-up with her neurologist and rheumatologist.   Patient agreed and verbalized understanding of plan. I have reviewed the patients home medicines and have made adjustments as needed Patient afebrile with stable vitals.  Provided with return precautions.  Discharged in good condition.  Ddx: these are considered less likely due to history of present illness and physical exam - irritant contact dermatitis: patient without irritant exposure, patient playful on exam -DRESS: patient afebrile, stable vitals and playful on exam -atopic dermatitis: rash diffuse, not confined to flexural areas -anaphylaxis: patient playful on exam; O2 100%, no nasal flaring or troubles breathing -SJS/TEN: negative Nikolsky  sign -[facility] Spotted Fever/Lyme Disease: no hx tick bite or fever   Social Determinants of Health:  none         Final Clinical Impression(s) / ED Diagnoses Final diagnoses:  Fibromyalgia  Rash    Rx / DC Orders ED Discharge Orders     None         Margarita Rana 06/11/23 2052    Benjiman Core, MD 06/11/23 [phone]

## 2023-06-11 NOTE — ED Triage Notes (Signed)
Multiple complaint. Reports "red dots" on skin, that only she can see, generalized pains. Back and chest pains.  Reports some confusion and talking to walks  Noticed on Thursday

## 2023-06-13 DIAGNOSIS — Z1231 Encounter for screening mammogram for malignant neoplasm of breast: Secondary | ICD-10-CM

## 2023-08-13 IMAGING — MG MM DIGITAL SCREENING BILAT W/ TOMO AND CAD
8 series · 8 of 24 positions shown · non-contrast
Comparison: Previous exam(s).

CLINICAL DATA: Screening.

EXAM:
DIGITAL SCREENING BILATERAL MAMMOGRAM WITH TOMOSYNTHESIS AND CAD
TECHNIQUE: Bilateral screening digital craniocaudal and mediolateral oblique
mammograms were obtained. Bilateral screening digital breast
tomosynthesis was performed. The images were evaluated with
computer-aided detection.

[R CC synth-2D]
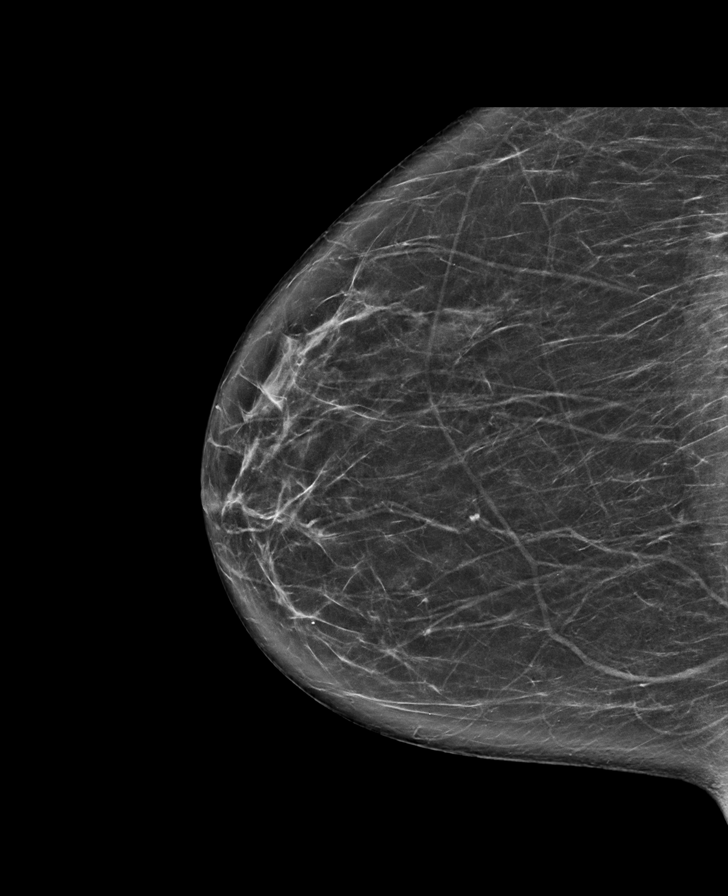

[R MLO synth-2D]
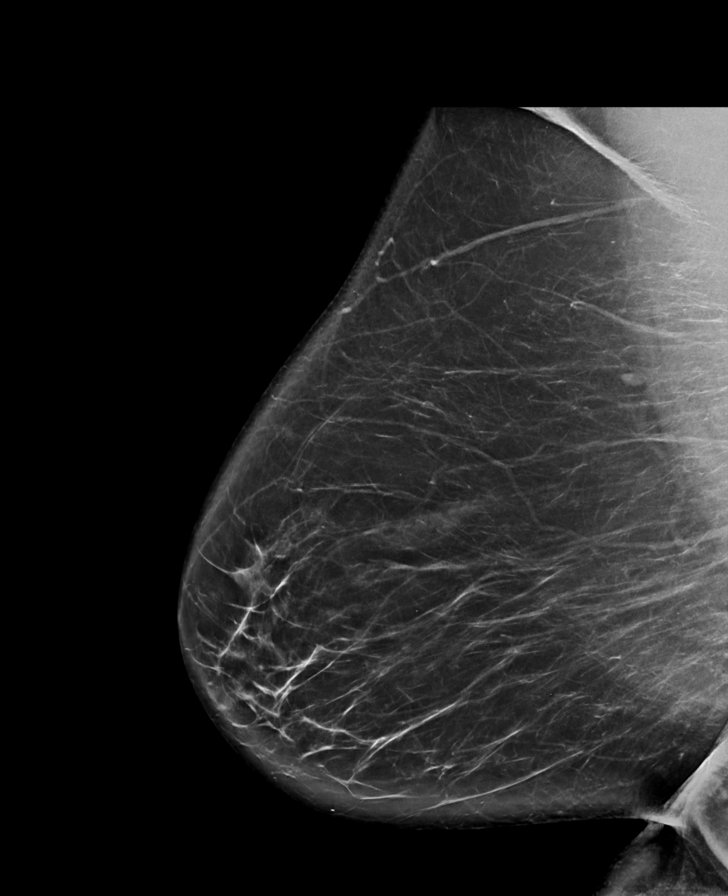

[L MLO synth-2D]
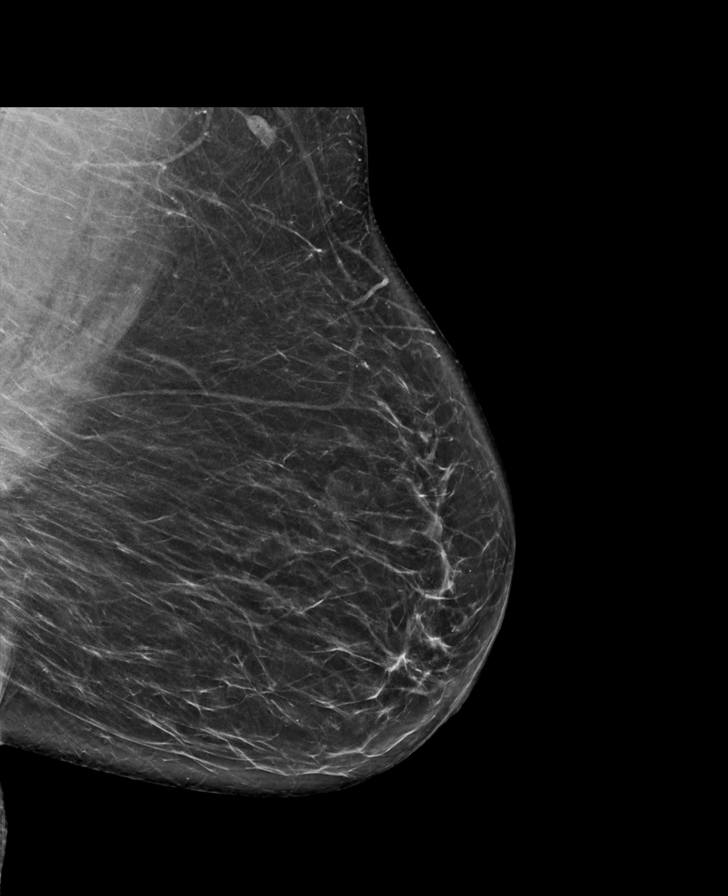

[L CC synth-2D]
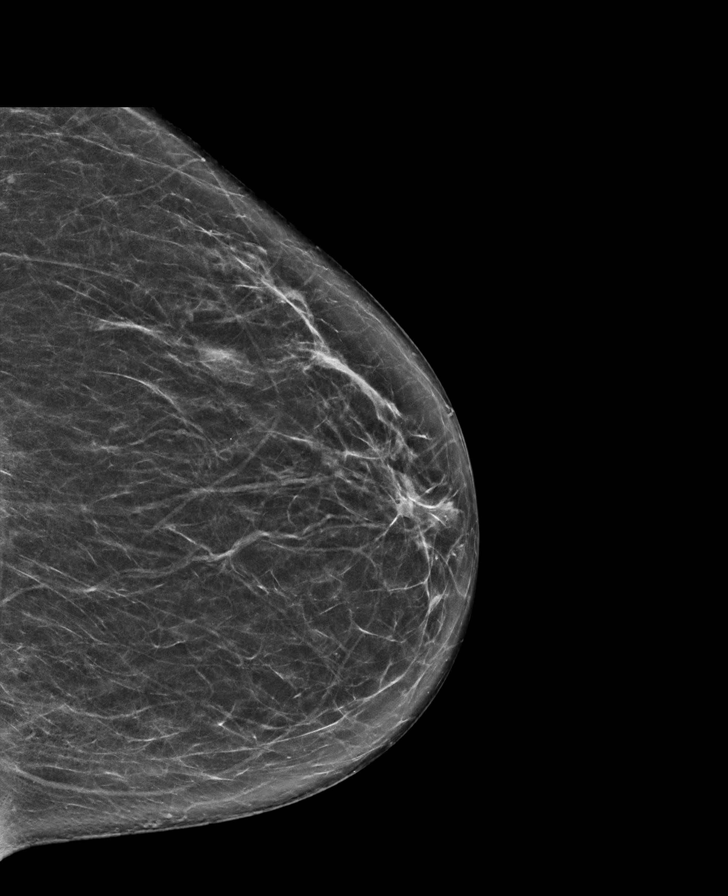

[R CC tomo · tomo slice 40/79.0]
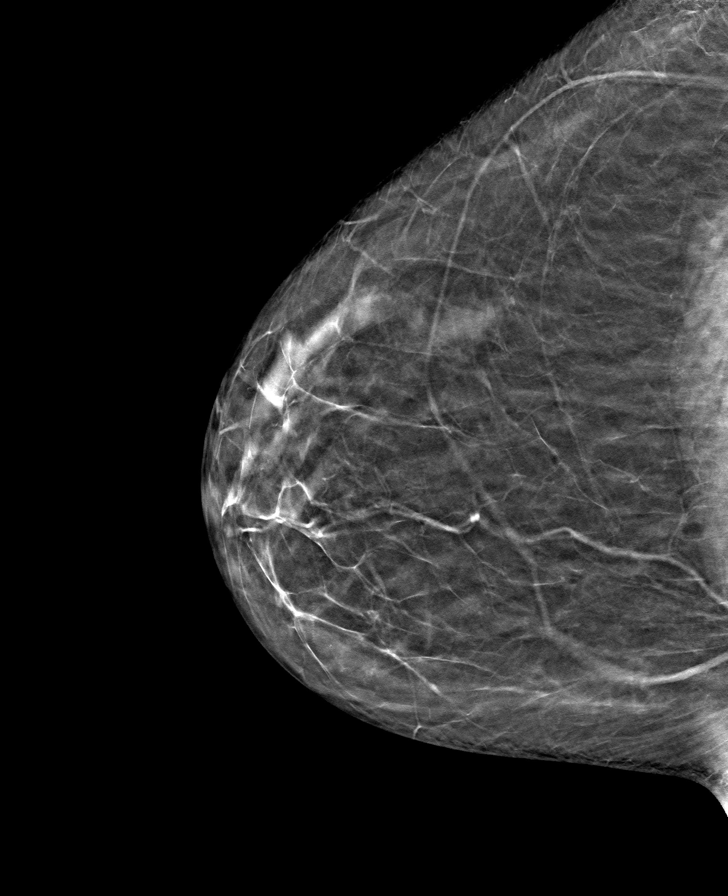

[L MLO tomo · tomo slice 41/82.0]
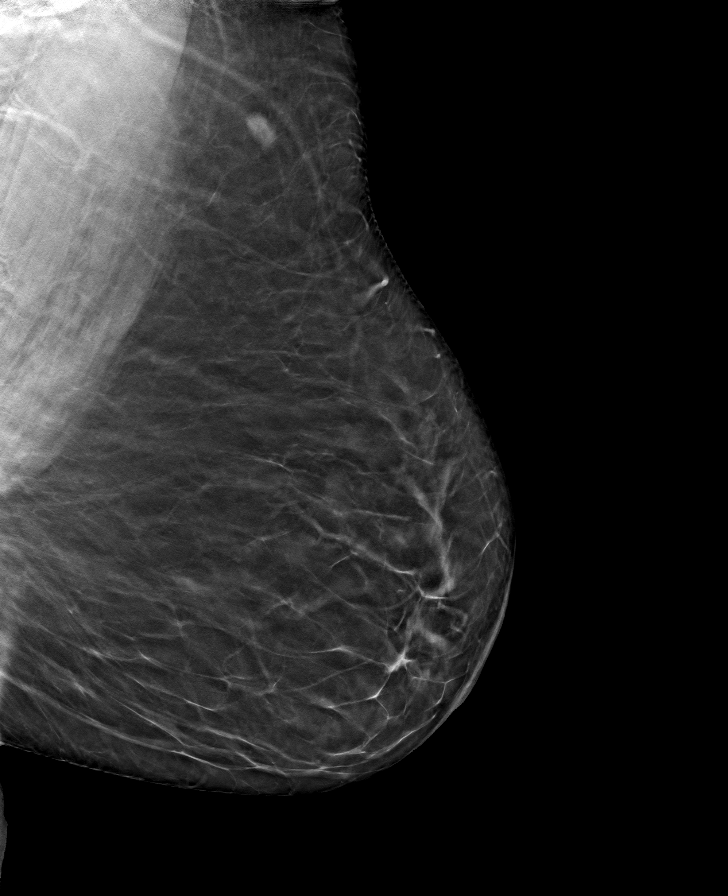

[L CC tomo · tomo slice 36/71.0]
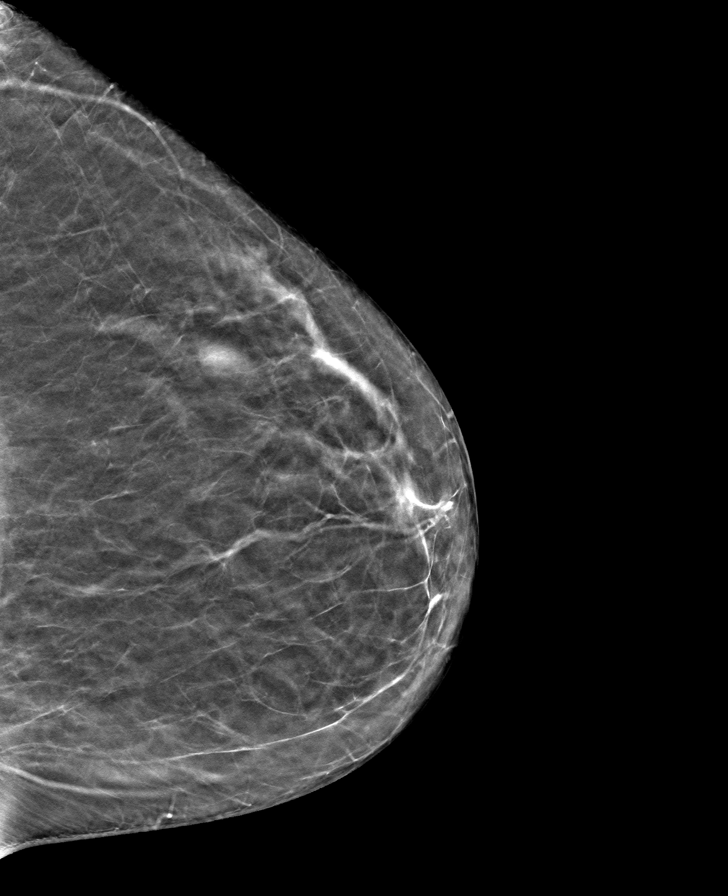

[R MLO tomo · tomo slice 45/90.0]
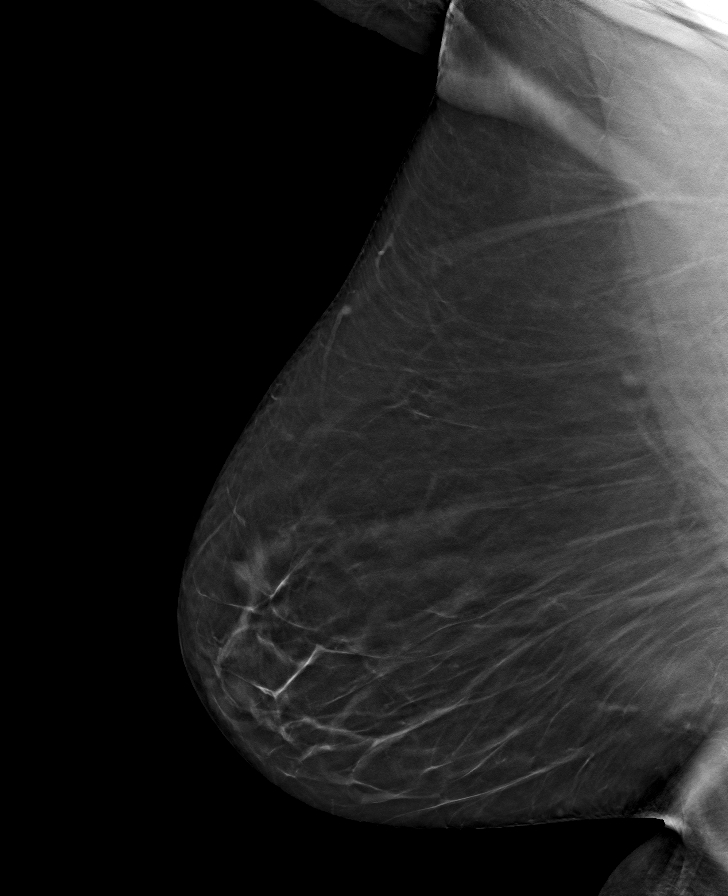

[8 of 24 positions shown; findings below may reference images not displayed]

ACR Breast Density Category b: There are scattered areas of
fibroglandular density.
FINDINGS: There are no findings suspicious for malignancy.
IMPRESSION: No mammographic evidence of malignancy. A result letter of this
screening mammogram will be mailed directly to the patient.

RECOMMENDATION:
Screening mammogram in one year. (Code:51-O-LD2)

BI-RADS CATEGORY  1: Negative.

## 2024-07-26 ENCOUNTER — Other Ambulatory Visit: Payer: Self-pay | Admitting: Family Medicine

## 2024-07-26 DIAGNOSIS — Z1231 Encounter for screening mammogram for malignant neoplasm of breast: Secondary | ICD-10-CM

## 2024-07-31 ENCOUNTER — Other Ambulatory Visit: Payer: Self-pay | Admitting: Nurse Practitioner

## 2024-07-31 DIAGNOSIS — G609 Hereditary and idiopathic neuropathy, unspecified: Secondary | ICD-10-CM

## 2024-08-12 ENCOUNTER — Ambulatory Visit
Admission: RE | Admit: 2024-08-12 | Discharge: 2024-08-12 | Disposition: A | Discharge status: Home / Self Care | Source: Ambulatory Visit | Attending: Family Medicine | Admitting: Family Medicine

## 2024-08-12 DIAGNOSIS — Z1231 Encounter for screening mammogram for malignant neoplasm of breast: Secondary | ICD-10-CM

## 2024-09-03 ENCOUNTER — Ambulatory Visit
Admission: RE | Admit: 2024-09-03 | Discharge: 2024-09-03 | Disposition: A | Discharge status: Home / Self Care | Source: Ambulatory Visit | Attending: Nurse Practitioner | Admitting: Nurse Practitioner

## 2024-09-03 ENCOUNTER — Ambulatory Visit
Admission: RE | Admit: 2024-09-03 | Discharge: 2024-09-03 | Disposition: A | Source: Ambulatory Visit | Attending: Nurse Practitioner | Admitting: Nurse Practitioner

## 2024-09-03 DIAGNOSIS — G609 Hereditary and idiopathic neuropathy, unspecified: Secondary | ICD-10-CM
